# Patient Record
Sex: Female | Born: 1950 | Race: White | Hispanic: No | Smoking: Never smoker
Health system: Southern US, Community
[De-identification: ages and names within clinical notes are randomized; demographics above are authoritative.]

## PROBLEM LIST (undated history)

## (undated) DIAGNOSIS — F32A Depression, unspecified: Secondary | ICD-10-CM

## (undated) DIAGNOSIS — M069 Rheumatoid arthritis, unspecified: Secondary | ICD-10-CM

## (undated) DIAGNOSIS — J302 Other seasonal allergic rhinitis: Secondary | ICD-10-CM

## (undated) DIAGNOSIS — F329 Major depressive disorder, single episode, unspecified: Secondary | ICD-10-CM

## (undated) DIAGNOSIS — Z973 Presence of spectacles and contact lenses: Secondary | ICD-10-CM

## (undated) DIAGNOSIS — F419 Anxiety disorder, unspecified: Secondary | ICD-10-CM

## (undated) DIAGNOSIS — K219 Gastro-esophageal reflux disease without esophagitis: Secondary | ICD-10-CM

## (undated) HISTORY — PX: INGUINAL HERNIA REPAIR: SUR1180

## (undated) HISTORY — PX: COLONOSCOPY: SHX174

## (undated) HISTORY — PX: TUBAL LIGATION: SHX77

---

## 1983-07-09 HISTORY — PX: SEPTOPLASTY: SUR1290

## 1993-07-08 HISTORY — PX: DILATION AND CURETTAGE OF UTERUS: SHX78

## 1995-07-09 HISTORY — PX: ABDOMINAL HYSTERECTOMY: SHX81

## 1998-04-13 ENCOUNTER — Other Ambulatory Visit: Admission: RE | Admit: 1998-04-13 | Discharge: 1998-04-13 | Payer: Self-pay | Admitting: *Deleted

## 1999-05-10 ENCOUNTER — Other Ambulatory Visit: Admission: RE | Admit: 1999-05-10 | Discharge: 1999-05-10 | Payer: Self-pay | Admitting: *Deleted

## 1999-12-28 ENCOUNTER — Emergency Department (HOSPITAL_COMMUNITY): Admission: EM | Admit: 1999-12-28 | Discharge: 1999-12-28 | Payer: Self-pay | Admitting: Podiatry

## 2000-05-01 ENCOUNTER — Ambulatory Visit (HOSPITAL_COMMUNITY): Admission: RE | Admit: 2000-05-01 | Discharge: 2000-05-01 | Payer: Self-pay | Admitting: *Deleted

## 2000-05-21 ENCOUNTER — Other Ambulatory Visit: Admission: RE | Admit: 2000-05-21 | Discharge: 2000-05-21 | Payer: Self-pay | Admitting: *Deleted

## 2001-07-27 ENCOUNTER — Other Ambulatory Visit: Admission: RE | Admit: 2001-07-27 | Discharge: 2001-07-27 | Payer: Self-pay | Admitting: *Deleted

## 2002-08-03 ENCOUNTER — Other Ambulatory Visit: Admission: RE | Admit: 2002-08-03 | Discharge: 2002-08-03 | Payer: Self-pay | Admitting: *Deleted

## 2003-07-18 ENCOUNTER — Other Ambulatory Visit: Admission: RE | Admit: 2003-07-18 | Discharge: 2003-07-18 | Payer: Self-pay | Admitting: Family Medicine

## 2004-01-31 ENCOUNTER — Ambulatory Visit (HOSPITAL_COMMUNITY): Admission: RE | Admit: 2004-01-31 | Discharge: 2004-01-31 | Payer: Self-pay | Admitting: Family Medicine

## 2008-07-28 ENCOUNTER — Other Ambulatory Visit: Admission: RE | Admit: 2008-07-28 | Discharge: 2008-07-28 | Payer: Self-pay | Admitting: Family Medicine

## 2009-07-08 HISTORY — PX: BLEPHAROPLASTY: SUR158

## 2013-06-29 ENCOUNTER — Other Ambulatory Visit: Payer: Self-pay | Admitting: Orthopedic Surgery

## 2013-07-13 ENCOUNTER — Encounter (HOSPITAL_BASED_OUTPATIENT_CLINIC_OR_DEPARTMENT_OTHER): Payer: Self-pay | Admitting: *Deleted

## 2013-07-13 NOTE — Progress Notes (Signed)
No labs needed

## 2013-07-15 NOTE — H&P (Signed)
  Shelia Kerr is an 63 y.o. female.   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 She is busy swimming for exercise. She has noted a mass at the anterior antecubital region of her left elbow 6 months prior. This measures 2 by 2 cm. It is soft, mobile and nontender. She has other family members who have had masses excised that were noted to be degenerative myxoid cysts. She seeks an upper extremity consult for biopsy.    Past Medical History  Diagnosis Date  . Rheumatoid arthritis   . Wears glasses   . Seasonal allergies   . GERD (gastroesophageal reflux disease)     occ  . Depression   . Anxiety     Past Surgical History  Procedure Laterality Date  . Blepharoplasty  2011    both eyes  . Inguinal hernia repair      right-before age 87  . Abdominal hysterectomy  1997  . Tubal ligation    . Dilation and curettage of uterus  1995    fibroid  . Colonoscopy    . Septoplasty  1985    History reviewed. No pertinent family history. Social History:  reports that she has never smoked. She does not have any smokeless tobacco history on file. She reports that she drinks alcohol. She reports that she does not use illicit drugs.  Allergies:  Allergies  Allergen Reactions  . Sulfa Antibiotics Hives    Mouth and perineal blisters    No prescriptions prior to admission    No results found for this or any previous visit (from the past 47 hour(s)).  No results found.   Pertinent items are noted in HPI.  Height 6' (1.829 m), weight 82.555 kg (182 lb).  General appearance: alert Head: Normocephalic, without obvious abnormality Neck: supple, symmetrical, trachea midline Resp: clear to auscultation bilaterally Cardio: regular rate and rhythm GI: normal findings: bowel sounds normal Extremities: . 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 This is mobile and  nontender and has the feel of a lipoma. Her pulses and capillary refill are intact. Her motor and sensory exam is unremarkable. She has full ROM of her elbow, forearm, wrist and fingers.   Plain films are nondiagnostic Pulses: 2+ and symmetric Skin: normal Neurologic: Grossly normal    Assessment/Plan Impression:  Mass left antecubital fossa.  Plan: To the OR for excisional biopsy of mass left antecubital fossa.The procedure, risks,benefits and post-op course were discussed with the patient at length and they were in agreement with the plan.  DASNOIT,Lucciano Vitali J 07/15/2013, 4:51 PM  H&P documentation: 07/16/2013  -History and Physical Reviewed  -Patient has been re-examined  -No change in the plan of care  Cammie Sickle, MD

## 2013-07-16 ENCOUNTER — Ambulatory Visit (HOSPITAL_BASED_OUTPATIENT_CLINIC_OR_DEPARTMENT_OTHER): Payer: BC Managed Care – PPO | Admitting: *Deleted

## 2013-07-16 ENCOUNTER — Encounter (HOSPITAL_BASED_OUTPATIENT_CLINIC_OR_DEPARTMENT_OTHER): Payer: Self-pay | Admitting: *Deleted

## 2013-07-16 ENCOUNTER — Encounter (HOSPITAL_BASED_OUTPATIENT_CLINIC_OR_DEPARTMENT_OTHER): Payer: BC Managed Care – PPO | Admitting: *Deleted

## 2013-07-16 ENCOUNTER — Ambulatory Visit (HOSPITAL_BASED_OUTPATIENT_CLINIC_OR_DEPARTMENT_OTHER)
Admission: RE | Admit: 2013-07-16 | Discharge: 2013-07-16 | Disposition: A | Discharge status: Home / Self Care | Payer: BC Managed Care – PPO | Source: Ambulatory Visit | Attending: Orthopedic Surgery | Admitting: Orthopedic Surgery

## 2013-07-16 ENCOUNTER — Encounter (HOSPITAL_BASED_OUTPATIENT_CLINIC_OR_DEPARTMENT_OTHER)
Admission: RE | Disposition: A | Discharge status: Home / Self Care | Payer: Self-pay | Source: Ambulatory Visit | Attending: Orthopedic Surgery

## 2013-07-16 DIAGNOSIS — F329 Major depressive disorder, single episode, unspecified: Secondary | ICD-10-CM | POA: Insufficient documentation

## 2013-07-16 DIAGNOSIS — Z90710 Acquired absence of both cervix and uterus: Secondary | ICD-10-CM | POA: Insufficient documentation

## 2013-07-16 DIAGNOSIS — F411 Generalized anxiety disorder: Secondary | ICD-10-CM | POA: Insufficient documentation

## 2013-07-16 DIAGNOSIS — M069 Rheumatoid arthritis, unspecified: Secondary | ICD-10-CM | POA: Insufficient documentation

## 2013-07-16 DIAGNOSIS — Z882 Allergy status to sulfonamides status: Secondary | ICD-10-CM | POA: Insufficient documentation

## 2013-07-16 DIAGNOSIS — D1779 Benign lipomatous neoplasm of other sites: Secondary | ICD-10-CM | POA: Insufficient documentation

## 2013-07-16 DIAGNOSIS — K219 Gastro-esophageal reflux disease without esophagitis: Secondary | ICD-10-CM | POA: Insufficient documentation

## 2013-07-16 DIAGNOSIS — Z9889 Other specified postprocedural states: Secondary | ICD-10-CM | POA: Insufficient documentation

## 2013-07-16 DIAGNOSIS — F3289 Other specified depressive episodes: Secondary | ICD-10-CM | POA: Insufficient documentation

## 2013-07-16 HISTORY — DX: Other seasonal allergic rhinitis: J30.2

## 2013-07-16 HISTORY — PX: BONE BIOPSY: SHX375

## 2013-07-16 HISTORY — DX: Gastro-esophageal reflux disease without esophagitis: K21.9

## 2013-07-16 HISTORY — DX: Major depressive disorder, single episode, unspecified: F32.9

## 2013-07-16 HISTORY — DX: Rheumatoid arthritis, unspecified: M06.9

## 2013-07-16 HISTORY — DX: Anxiety disorder, unspecified: F41.9

## 2013-07-16 HISTORY — DX: Depression, unspecified: F32.A

## 2013-07-16 HISTORY — DX: Presence of spectacles and contact lenses: Z97.3

## 2013-07-16 SURGERY — BIOPSY, BONE
Anesthesia: Monitor Anesthesia Care | Site: Arm Upper | Laterality: Left

## 2013-07-16 MED ORDER — MIDAZOLAM HCL 5 MG/5ML IJ SOLN
INTRAMUSCULAR | Status: DC | PRN
  Administered 2013-07-16: 2 mg via INTRAVENOUS

## 2013-07-16 MED ORDER — LACTATED RINGERS IV SOLN
INTRAVENOUS | Status: DC
  Administered 2013-07-16: 08:00:00 via INTRAVENOUS

## 2013-07-16 MED ORDER — MIDAZOLAM HCL 2 MG/2ML IJ SOLN: 1.0000 mg | INTRAMUSCULAR | Status: DC | PRN

## 2013-07-16 MED ORDER — CHLORHEXIDINE GLUCONATE 4 % EX LIQD
60.0000 mL | Freq: Once | CUTANEOUS | Status: AC
  Administered 2013-07-16: 4 via TOPICAL

## 2013-07-16 MED ORDER — ONDANSETRON HCL 4 MG/2ML IJ SOLN: 4.0000 mg | Freq: Four times a day (QID) | INTRAMUSCULAR | Status: DC | PRN

## 2013-07-16 MED ORDER — FENTANYL CITRATE 0.05 MG/ML IJ SOLN
INTRAMUSCULAR | Status: DC | PRN
  Administered 2013-07-16: 100 ug via INTRAVENOUS

## 2013-07-16 MED ORDER — ONDANSETRON HCL 4 MG/2ML IJ SOLN
INTRAMUSCULAR | Status: DC | PRN
  Administered 2013-07-16: 4 mg via INTRAVENOUS

## 2013-07-16 MED ORDER — FENTANYL CITRATE 0.05 MG/ML IJ SOLN: 25.0000 ug | INTRAMUSCULAR | Status: DC | PRN

## 2013-07-16 MED ORDER — PROPOFOL INFUSION 10 MG/ML OPTIME
INTRAVENOUS | Status: DC | PRN
  Administered 2013-07-16: 100 ug/kg/min via INTRAVENOUS

## 2013-07-16 MED ORDER — OXYCODONE HCL 5 MG/5ML PO SOLN: 5.0000 mg | Freq: Once | ORAL | Status: DC | PRN

## 2013-07-16 MED ORDER — PROPOFOL 10 MG/ML IV BOLUS
INTRAVENOUS | Status: AC
  Filled 2013-07-16: qty 20

## 2013-07-16 MED ORDER — METHYLPREDNISOLONE ACETATE 40 MG/ML IJ SUSP
INTRAMUSCULAR | Status: AC
  Filled 2013-07-16: qty 1

## 2013-07-16 MED ORDER — MIDAZOLAM HCL 2 MG/2ML IJ SOLN
INTRAMUSCULAR | Status: AC
  Filled 2013-07-16: qty 2

## 2013-07-16 MED ORDER — PROPOFOL 10 MG/ML IV BOLUS
INTRAVENOUS | Status: DC | PRN
  Administered 2013-07-16: 40 mg via INTRAVENOUS

## 2013-07-16 MED ORDER — LIDOCAINE HCL 2 % IJ SOLN
INTRAMUSCULAR | Status: AC
  Filled 2013-07-16: qty 20

## 2013-07-16 MED ORDER — FENTANYL CITRATE 0.05 MG/ML IJ SOLN: 50.0000 ug | INTRAMUSCULAR | Status: DC | PRN

## 2013-07-16 MED ORDER — LIDOCAINE HCL 2 % IJ SOLN
INTRAMUSCULAR | Status: DC | PRN
  Administered 2013-07-16: 7 mL

## 2013-07-16 MED ORDER — OXYCODONE HCL 5 MG PO TABS: 5.0000 mg | ORAL_TABLET | Freq: Once | ORAL | Status: DC | PRN

## 2013-07-16 MED ORDER — FENTANYL CITRATE 0.05 MG/ML IJ SOLN
INTRAMUSCULAR | Status: AC
  Filled 2013-07-16: qty 4

## 2013-07-16 SURGICAL SUPPLY — 58 items
BANDAGE ADH SHEER 1  50/CT (GAUZE/BANDAGES/DRESSINGS) IMPLANT
BANDAGE ELASTIC 3 VELCRO ST LF (GAUZE/BANDAGES/DRESSINGS) IMPLANT
BANDAGE ELASTIC 4 VELCRO ST LF (GAUZE/BANDAGES/DRESSINGS) IMPLANT
BLADE SURG 15 STRL LF DISP TIS (BLADE) ×2 IMPLANT
BLADE SURG 15 STRL SS (BLADE) ×4
BNDG COHESIVE 3X5 TAN STRL LF (GAUZE/BANDAGES/DRESSINGS) IMPLANT
BNDG COHESIVE 4X5 TAN STRL (GAUZE/BANDAGES/DRESSINGS) IMPLANT
BNDG ESMARK 4X9 LF (GAUZE/BANDAGES/DRESSINGS) ×3 IMPLANT
BNDG GAUZE ELAST 4 BULKY (GAUZE/BANDAGES/DRESSINGS) IMPLANT
BRUSH SCRUB EZ PLAIN DRY (MISCELLANEOUS) ×3 IMPLANT
CLOSURE WOUND 1/2 X4 (GAUZE/BANDAGES/DRESSINGS) ×1
CORDS BIPOLAR (ELECTRODE) ×3 IMPLANT
COVER MAYO STAND STRL (DRAPES) ×3 IMPLANT
COVER TABLE BACK 60X90 (DRAPES) ×3 IMPLANT
CUFF TOURNIQUET SINGLE 18IN (TOURNIQUET CUFF) ×3 IMPLANT
DECANTER SPIKE VIAL GLASS SM (MISCELLANEOUS) IMPLANT
DRAPE EXTREMITY T 121X128X90 (DRAPE) ×3 IMPLANT
DRAPE SURG 17X23 STRL (DRAPES) ×3 IMPLANT
DRSG TEGADERM 2-3/8X2-3/4 SM (GAUZE/BANDAGES/DRESSINGS) ×3 IMPLANT
GAUZE XEROFORM 1X8 LF (GAUZE/BANDAGES/DRESSINGS) ×3 IMPLANT
GLOVE BIO SURGEON STRL SZ 6.5 (GLOVE) ×2 IMPLANT
GLOVE BIO SURGEONS STRL SZ 6.5 (GLOVE) ×1
GLOVE BIOGEL M STRL SZ7.5 (GLOVE) ×3 IMPLANT
GLOVE BIOGEL PI IND STRL 7.0 (GLOVE) ×2 IMPLANT
GLOVE BIOGEL PI INDICATOR 7.0 (GLOVE) ×4
GLOVE ECLIPSE 7.0 STRL STRAW (GLOVE) ×3 IMPLANT
GLOVE ORTHO TXT STRL SZ7.5 (GLOVE) ×3 IMPLANT
GOWN STRL REUS W/ TWL LRG LVL3 (GOWN DISPOSABLE) ×1 IMPLANT
GOWN STRL REUS W/ TWL XL LVL3 (GOWN DISPOSABLE) ×2 IMPLANT
GOWN STRL REUS W/TWL LRG LVL3 (GOWN DISPOSABLE) ×2
GOWN STRL REUS W/TWL XL LVL3 (GOWN DISPOSABLE) ×4
NEEDLE 27GAX1X1/2 (NEEDLE) ×3 IMPLANT
NS IRRIG 1000ML POUR BTL (IV SOLUTION) ×3 IMPLANT
PACK BASIN DAY SURGERY FS (CUSTOM PROCEDURE TRAY) ×3 IMPLANT
PAD CAST 3X4 CTTN HI CHSV (CAST SUPPLIES) IMPLANT
PADDING CAST ABS 4INX4YD NS (CAST SUPPLIES)
PADDING CAST ABS COTTON 4X4 ST (CAST SUPPLIES) IMPLANT
PADDING CAST COTTON 3X4 STRL (CAST SUPPLIES)
SPLINT PLASTER CAST XFAST 3X15 (CAST SUPPLIES) IMPLANT
SPLINT PLASTER XTRA FASTSET 3X (CAST SUPPLIES)
SPONGE GAUZE 2X2 8PLY STER LF (GAUZE/BANDAGES/DRESSINGS) ×1
SPONGE GAUZE 2X2 8PLY STRL LF (GAUZE/BANDAGES/DRESSINGS) ×2 IMPLANT
SPONGE GAUZE 4X4 12PLY (GAUZE/BANDAGES/DRESSINGS) ×3 IMPLANT
STOCKINETTE 4X48 STRL (DRAPES) ×3 IMPLANT
STRIP CLOSURE SKIN 1/2X4 (GAUZE/BANDAGES/DRESSINGS) ×2 IMPLANT
SUT ETHIBOND 3-0 V-5 (SUTURE) IMPLANT
SUT PROLENE 3 0 PS 2 (SUTURE) ×3 IMPLANT
SUT VIC AB 0 SH 27 (SUTURE) IMPLANT
SUT VIC AB 2-0 PS2 27 (SUTURE) IMPLANT
SUT VIC AB 3-0 FS2 27 (SUTURE) IMPLANT
SUT VIC AB 4-0 BRD 54 (SUTURE) IMPLANT
SUT VIC AB 4-0 P-3 18XBRD (SUTURE) ×1 IMPLANT
SUT VIC AB 4-0 P3 18 (SUTURE) ×2
SYR 3ML 23GX1 SAFETY (SYRINGE) IMPLANT
SYR CONTROL 10ML LL (SYRINGE) ×3 IMPLANT
TOWEL OR 17X24 6PK STRL BLUE (TOWEL DISPOSABLE) ×3 IMPLANT
TRAY DSU PREP LF (CUSTOM PROCEDURE TRAY) ×3 IMPLANT
UNDERPAD 30X30 INCONTINENT (UNDERPADS AND DIAPERS) ×3 IMPLANT

## 2013-07-16 NOTE — Brief Op Note (Signed)
07/16/2013  8:10 AM  PATIENT:  Shelia Kerr  63 y.o. female  PRE-OPERATIVE DIAGNOSIS:  LIPOMA LEFT ANTECUBITAL FOSSA  POST-OPERATIVE DIAGNOSIS: LIPOMA OF LEFT ANTECUBITAL FOSSA  PROCEDURE:  Procedure(s): EXCISIONAL BIOPSY LEFT ANTECUBITAL FOSSA (Left)  SURGEON:  Surgeon(s) and Role:    * Cammie Sickle., MD - Primary  PHYSICIAN ASSISTANT:   ASSISTANTS: SURGICAL TECH  ANESTHESIA:   IV sedation  EBL:  Total I/O In: 800 [I.V.:800] Out: -   BLOOD ADMINISTERED:none  DRAINS: none   LOCAL MEDICATIONS USED:  XYLOCAINE   SPECIMEN:  Biopsy / Limited Resection  DISPOSITION OF SPECIMEN:  PATHOLOGY  COUNTS:  YES  TOURNIQUET:   Total Tourniquet Time Documented: Upper Arm (Left) - 11 minutes Total: Upper Arm (Left) - 11 minutes   DICTATION: .Other Dictation: Dictation Number [id number]  PLAN OF CARE: Discharge to home after PACU  PATIENT DISPOSITION:  PACU - hemodynamically stable.   Delay start of Pharmacological VTE agent (>24hrs) due to surgical blood loss or risk of bleeding: not applicable

## 2013-07-16 NOTE — Transfer of Care (Signed)
Immediate Anesthesia Transfer of Care Note  Patient: Shelia Kerr  Procedure(s) Performed: Procedure(s): EXCISIONAL BIOPSY LEFT ANTECUBITAL FOSSA (Left)  Patient Location: PACU  Anesthesia Type:MAC  Level of Consciousness: awake, alert  and oriented  Airway & Oxygen Therapy: Patient Spontanous Breathing and Patient connected to face mask oxygen  Post-op Assessment: Report given to PACU RN, Post -op Vital signs reviewed and stable and Patient moving all extremities  Post vital signs: Reviewed and stable  Complications: No apparent anesthesia complications

## 2013-07-16 NOTE — Anesthesia Postprocedure Evaluation (Signed)
Anesthesia Post Note  Patient: Shelia Kerr  Procedure(s) Performed: Procedure(s) (LRB): EXCISIONAL BIOPSY LEFT ANTECUBITAL FOSSA (Left)  Anesthesia type: MAC  Patient location: PACU  Post pain: Pain level controlled and Adequate analgesia  Post assessment: Post-op Vital signs reviewed, Patient's Cardiovascular Status Stable and Respiratory Function Stable  Last Vitals:  Filed Vitals:   07/16/13 0830  BP: 110/64  Pulse: 47  Temp:   Resp: 14    Post vital signs: Reviewed and stable  Level of consciousness: awake, alert  and oriented  Complications: No apparent anesthesia complications

## 2013-07-16 NOTE — Anesthesia Preprocedure Evaluation (Signed)
Anesthesia Evaluation  Patient identified by MRN, date of birth, ID band Patient awake    Reviewed: Allergy & Precautions, H&P , NPO status , Patient's Chart, lab work & pertinent test results  Airway Mallampati: II  Neck ROM: full    Dental   Pulmonary neg pulmonary ROS,          Cardiovascular negative cardio ROS      Neuro/Psych Anxiety Depression    GI/Hepatic GERD-  ,  Endo/Other    Renal/GU      Musculoskeletal  (+) Arthritis -, Rheumatoid disorders,    Abdominal   Peds  Hematology   Anesthesia Other Findings   Reproductive/Obstetrics                           Anesthesia Physical Anesthesia Plan  ASA: II  Anesthesia Plan: General   Post-op Pain Management:    Induction: Intravenous  Airway Management Planned: LMA  Additional Equipment:   Intra-op Plan:   Post-operative Plan:   Informed Consent: I have reviewed the patients History and Physical, chart, labs and discussed the procedure including the risks, benefits and alternatives for the proposed anesthesia with the patient or authorized representative who has indicated his/her understanding and acceptance.     Plan Discussed with: CRNA, Anesthesiologist and Surgeon  Anesthesia Plan Comments:         Anesthesia Quick Evaluation

## 2013-07-16 NOTE — Anesthesia Procedure Notes (Signed)
Procedure Name: MAC Date/Time: 07/16/2013 7:42 AM Performed by: Cammie Sickle Pre-anesthesia Checklist: Patient identified, Emergency Drugs available, Suction available and Patient being monitored Patient Re-evaluated:Patient Re-evaluated prior to inductionOxygen Delivery Method: Simple face mask Preoxygenation: Pre-oxygenation with 100% oxygen Dental Injury: Teeth and Oropharynx as per pre-operative assessment

## 2013-07-16 NOTE — Discharge Instructions (Addendum)

## 2013-07-16 NOTE — Op Note (Signed)
[id number]  

## 2013-07-17 NOTE — Op Note (Signed)
NAMEADRIA, [name]              ACCOUNT NO.:  [id number]  MEDICAL RECORD NO.:  [id number]  LOCATION:                                 FACILITY:  PHYSICIAN:  Shelia Kerr, M.D.      DATE OF BIRTH:  DATE OF PROCEDURE:  07/16/2013 DATE OF DISCHARGE:                              OPERATIVE REPORT   PREOPERATIVE DIAGNOSIS:  A 2 x 2-cm mass consistent with lipoma, left antecubital fossa.  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  OPERATIONS:  Excisional biopsy of 2 x 2-cm mass, left antecubital fossa.  OPERATING SURGEON:  Shelia Mighty. Jaqulyn Chancellor, MD.  ASSISTANT:  Surgical technician.  ANESTHESIA:  2% lidocaine field block supplemented by IV sedation.  SUPERVISING ANESTHESIOLOGIST:  Shelia Ghee, MD.  INDICATIONS:  Shelia Kerr is a 63 year old woman referred through the courtesy of her attending rheumatologist, Dr. Gavin Pound for biopsy of an antecubital mass on the left.  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  She was concerned about an enlarging mass in her antecubital fossa.  We advised her that this likely was a well-differentiated lipoma and could be observed.  She requested that we proceed with biopsy for confirmation of this impression.  She is scheduled for excisional biopsy under local anesthesia and sedation at this time.  PROCEDURE IN DETAIL:  Shelia Kerr was brought to room 1 of the [facility] and placed in supine position on the operating table.  Following IV sedation, the left antecubital fossa was prepped with Betadine and infiltrated with 7 mL of 2% lidocaine.  After few moments, excellent anesthesia was achieved.  The left hand and arm were then prepped with Betadine soap and solution, sterilely draped.  A pneumatic tourniquet was applied to the proximal left brachium.  Following exsanguination of the left arm with Esmarch bandage, arterial tourniquet was inflated to 220 mmHg.  A routine  surgical time-out was accomplished.  The procedure commenced with a transverse incision of 2 cm directly over the mass.  Subcutaneous tissues were carefully divided taking care to look for branches of the medial antebrachial cutaneous nerve.  With very careful dissection, a well differentiated lipoma was dissected off the fascia and surrounding subcutaneous tissues.  This was very well differentiated and quite challenging to differentiate from the surrounding normal adipose tissue, except for the fact that it had a firmer density.  With great care, this was circumferentially dissected and removed and placed in formalin for pathologic evaluation.  I then used a rongeur to contour the adjacent fat to prevent a dimple defect.  The wound was inspected for bleeding points followed by repair of the skin with subcutaneous 4-0 Vicryl and intradermal 3-0 Prolene with Steri-Strips.  Sterile gauze and Tegaderm was applied for postoperative sterile dressing.  There were no apparent complications.  For aftercare, Shelia Kerr is provided a prescription for naproxen sodium. She reports that she already has some at home and does not require a formal prescription.  She will also use Tylenol.  She chooses not to have any opioid analgesics.     Shelia Mighty Shelia Kerr, M.D.     RVS/MEDQ  D:  07/16/2013  T:  07/16/2013  Job:  [id number]  cc:   Gavin Pound, MD

## 2013-07-19 ENCOUNTER — Encounter (HOSPITAL_BASED_OUTPATIENT_CLINIC_OR_DEPARTMENT_OTHER): Payer: Self-pay | Admitting: Orthopedic Surgery

## 2013-11-18 ENCOUNTER — Other Ambulatory Visit: Payer: Self-pay | Admitting: Family Medicine

## 2013-11-18 DIAGNOSIS — E010 Iodine-deficiency related diffuse (endemic) goiter: Secondary | ICD-10-CM

## 2013-11-22 ENCOUNTER — Ambulatory Visit
Admission: RE | Admit: 2013-11-22 | Discharge: 2013-11-22 | Disposition: A | Discharge status: Home / Self Care | Payer: BC Managed Care – PPO | Source: Ambulatory Visit | Attending: Family Medicine | Admitting: Family Medicine

## 2013-11-22 DIAGNOSIS — E010 Iodine-deficiency related diffuse (endemic) goiter: Secondary | ICD-10-CM

## 2014-01-18 DIAGNOSIS — N8110 Cystocele, unspecified: Secondary | ICD-10-CM | POA: Insufficient documentation

## 2014-01-18 DIAGNOSIS — R32 Unspecified urinary incontinence: Secondary | ICD-10-CM | POA: Insufficient documentation

## 2014-06-01 ENCOUNTER — Other Ambulatory Visit: Payer: Self-pay | Admitting: Dermatology

## 2015-09-18 DIAGNOSIS — M0609 Rheumatoid arthritis without rheumatoid factor, multiple sites: Secondary | ICD-10-CM | POA: Insufficient documentation

## 2017-02-26 ENCOUNTER — Other Ambulatory Visit: Payer: Self-pay | Admitting: Family Medicine

## 2017-02-26 DIAGNOSIS — R1084 Generalized abdominal pain: Secondary | ICD-10-CM

## 2017-03-11 ENCOUNTER — Ambulatory Visit
Admission: RE | Admit: 2017-03-11 | Discharge: 2017-03-11 | Disposition: A | Discharge status: Home / Self Care | Payer: Medicare Other | Source: Ambulatory Visit | Attending: Family Medicine | Admitting: Family Medicine

## 2017-03-11 DIAGNOSIS — R1084 Generalized abdominal pain: Secondary | ICD-10-CM

## 2017-03-12 ENCOUNTER — Other Ambulatory Visit: Payer: BC Managed Care – PPO

## 2018-01-13 ENCOUNTER — Other Ambulatory Visit: Payer: Self-pay | Admitting: Family Medicine

## 2018-01-13 ENCOUNTER — Ambulatory Visit
Admission: RE | Admit: 2018-01-13 | Discharge: 2018-01-13 | Disposition: A | Discharge status: Home / Self Care | Payer: Medicare Other | Source: Ambulatory Visit | Attending: Family Medicine | Admitting: Family Medicine

## 2018-01-13 DIAGNOSIS — R05 Cough: Secondary | ICD-10-CM

## 2018-01-13 DIAGNOSIS — R059 Cough, unspecified: Secondary | ICD-10-CM

## 2018-04-22 ENCOUNTER — Ambulatory Visit: Payer: Medicare Other | Admitting: Psychiatry

## 2018-04-22 DIAGNOSIS — F4323 Adjustment disorder with mixed anxiety and depressed mood: Secondary | ICD-10-CM | POA: Insufficient documentation

## 2018-04-22 DIAGNOSIS — M0609 Rheumatoid arthritis without rheumatoid factor, multiple sites: Secondary | ICD-10-CM | POA: Diagnosis not present

## 2018-04-22 NOTE — Assessment & Plan Note (Addendum)
Longterm adjustment to family issues incl. adult daughter's psych issues, adult son's Asperger's, and divorce after long, punishing marriage to NPD

## 2018-04-22 NOTE — Assessment & Plan Note (Signed)
A/o 04/22/18, reports 3 week on Plaquanil

## 2018-04-22 NOTE — Progress Notes (Signed)
[facility] Counselor/Therapist Progress Note   Patient ID: Shelia Kerr, MRN: [id number]  Date: 04/22/2018  Timespent: 50 minutes Start time: 8:15a Stop time: 9:06a  Treatment Type: Individual  Subjective:  Depressive this summer.  On Plaquanil (generic) for RA now about 3 weeks, coming off frustration of summer in more pain while unmedicated.  67yo now and feeling age.  Son Legrand Como (Asperger's, [place]) still living with her between assignments, and his plans to return to [place] frustrated by new red tape.  Asperger's habits of being up till 3am, compulsive chess-playing, brittle reactions, and family characteristic of defensiveness at small things.  Patient struggling sometimes with her own defensive reactions, but making shorter work of saying no, refusing shoehorned interactions, and seemingly doing so with less ire and defensiveness over time.  Still hopeful of his transition back to living independently, whether overseas or here.  Notable argument about the origins of oak trees at the cemetery by [facility], seemed to be more a conflict about whether she is allowed to have an independent point of view than the actual subject matter.    [name] got into EMDR for her childhood triggers but not keeping house, relapsing in brittle reactions, had a meltdown while on hers and Heron Sabins' delayed honeymoon in [place], enough to where [employer] her therapist.  PT generously had flooring put in to replace old, allergenic carpet and watched the house and dogs for 3 weeks, accompanied by her own good friend Suzi Roots, who has a Medical illustrator with [name].  Passed up several triggers to react, but grieving new indacaitons that [name] continues to have strong tendencies to expect others to change their behavior to settle her own distress, she is not making real friends where she is, and her husband is starting to take more of a stand on unreasonable behavior.  Worries she will reject a truly good  man, through being harsh, inhospitable, or letting her own defensive imagination get away with her.  Socially, not in a church yet or a book club that suits after 2 years.  Has been contacted for an Alzheimer's study through [facility] that may offer a social component but unsure of next steps, means to call back.  Has identified a multicultural [place] [employer] with a dynamic young pastor that is appealing, but held back by apprehension about possibly running into people who know her from before, and she experiences a constitutional aversion to having to be recognized, and feeling inevitably boxed into an old role.  Notable sign of life and finding herself attracted to a fellow passenger on the plane when traveling to [name].  Interventions:CBT, Counsellor and Reframing Agreed still working on deconditioning learned responses to conflict from father and ex-husband, reinforced in [place] environment with constitutionally stubborn, defensive, now-adult children.  Affirmed growing abilities to pass up squabbling, make shorter work of defensive conflict, rect less drastically to provocations, and employ strategic silence/nonresponse.  Challenged her assumptions about being "boxed in" by any encounter she imaginably have with acquaintances and visualized having a comforting touch in a comforting voice remind her in the midst of such an encounter she still has full choice and is not in any way defined by what she used to do, and particularly that she simply has the right to be a seeker of community and faith when she shows up.  Endorsed idea of Alzheimer study and contextualized fear of aging by comparing and contrasting her status and outlook with [place] family member.  Mental Status Exam:   Appearance:   Neat and Well Groomed     Behavior:  Appropriate and Sharing  Motor:  Normal  Speech/Language:   Clear and Coherent  Affect:  Appropriate and Full Range  Mood:  somewhat anxious, apprehensive   Thought process:  Relevant  Thought content:    Logical  Perceptual disturbances:    Normal  Orientation:  Full (Time, Place, and Person)  Attention:  Good  Concentration:  good  Memory:  No dfxs  Fund of knowledge:   Good  Insight:    Good  Judgment:   Good  Impulse Control:  good    Reported Symptoms:  Worry, body pain, fatigue, frustration  Risk Assessment: Danger to Self:  No Self-injurious Behavior: No Danger to Others: No Duty to Warn:no Physical Aggression / Violence:No  Access to Firearms a concern: No  Gang Involvement:No   Diagnosis:   ICD-10-CM   1. Adjustment disorder with mixed anxiety and depressed mood F43.23      Plan:  . Get in touch about Alzheimer's study and socialization opportunities involved . Go ahead and try out identified church of interest . Self-affirm that she is already not defined by the reactions of others but always has the right to decide how to play a situation that is initially uncomfortable.  "OK until proven otherwise." . RTO at discretion . Continue to utilize previously learned skills ad lib . Maintain medication, if prescribed, and work faithfully with relevant prescriber(s) . Call the clinic on-call service, present to ER, or call 911 if any life-threatening emergency  Blanchie Serve, PhD

## 2018-08-18 ENCOUNTER — Ambulatory Visit: Payer: Medicare Other | Admitting: Psychiatry

## 2018-08-18 DIAGNOSIS — F4323 Adjustment disorder with mixed anxiety and depressed mood: Secondary | ICD-10-CM

## 2018-08-18 NOTE — Progress Notes (Signed)
Psychotherapy Progress Note [facility] [name], PhD LP  Patient ID: Shelia Kerr     MRN: [id number]      Therapy format: Individual psychotherapy Date: 08/18/2018     Start: 8:11a Stop: 9:01a Time Spent: 50 min  Session narrative -- presenting needs, interim history, self-report of stressors and symptoms, applications of prior therapy, status changes, and interventions made in session "Here for my quarterly sanity check."  Shelia Kerr doing better, in general, EMDR making some difference.  Shelia Kerr seems to have a natural way with her emotional reactivity.  Recent inquiry from her about history when PT was irritable recovering form hysterectomy.  Managed to validate remembrance that she was angry back then and deliver a sincere sorry, without disputing memory details.  Confronted in process skipping on to other concerns without stopping to take credit/validation, pressed her to let the good news be good news about raising her communication and conflict skills before racing to the next problem.  Able to do so.  Christmas between the kids and their dad and his wife did not go well, but it was validating and nonprovocative between PT and her grown children.  True to form for ex-husband Shelia Kerr, Shelia Kerr wound up getting stuck with the bill at dinner, he took an inquiry about the history of an antique quilt as his cue to offer to sell it to her, and after she returned to [place] and stewed about it, [employer] him how she knows now about all his crimes back to teen years and has read his mother's journals to prove it (something he has worried would be discovered).  Also divulged to PT that Shelia Kerr disclosed how their marriage has never been consummated.  TMI, but also vindicating, and PT did not trigger upon any of these learnings to try to manage her kids' feelings or understandings about their father, just sedate and supportive.  Tiring of Shelia Kerr living with her and his 68 year old  early-teenager reactions.  Reacting to PT getting confused a little with things like "I can't believe you .Shelia Kerr Kitchen. you never listen" statements.  Asperger's drives great defensiveness and artful ways of criticizing her thinking, accusing her of emotional reasoning, and other derogatory things he has heard his father and sister say before.  Continued to advocate to PT to seek to manage the format of conflict more than trying to defend or logic any particular point -- e.g., ask if he is done, ask if she can take her turn, ask if he will let her make her point, etc.  PT fully expects he would cut her off and tell her she doesn't know how to make a point, so, if so, advised just call an end to the conversation and let him know she can talk when he is ready to make it a dialogue instead of a monologue.  Discussed briefly an anxiety-provoking scene in which he wanted to pilfer another customer's biscuits at [employer] because they were the "right" color and he was convinced they would not need them.  Actually questioned why it would be wrong to take them (momentarily evoking ex-husband's psychopathy).  PT's reflex to threaten to walk out if he does, and then to just say "It's not what we do".  Discussed ineffective discipline, empathized with anxious reflex to just spit out a sanction or an unexplained label, and encouraged to classify it, e.g., "Because it is stealing / not yours." and stay ready to manage the process, abandon content, and if hard-pressed,  allow son to meet social consequences.  Empathic stress and pressure to be responsible with a friend with breast cancer, father's health tanking, sisters turning to her for guidance.  Medically, had one cataract done recently, sees much better.  Other one to come soon.  Therapeutic modalities: Assertiveness/Communication, Solution-Oriented/Positive Psychology, Customer service manager and Family Systems  Mental Status/Observations:  Appearance:   Casual and Neat      Behavior:  Appropriate  Motor:  Normal  Speech/Language:   Clear and Coherent  Affect:  Appropriate and Full Range  Mood:  anxious  Thought process:  normal and some quickness to skip on; responsive to redirection  Thought content:    WNL and worries  Sensory/Perceptual disturbances:    WNL  Orientation:  WNL  Attention:  Good  Concentration:  Good  Memory:  WNL notes episodes of concern  Insight:    Good  Judgment:   Good  Impulse Control:  Good   Risk Assessment: Danger to Self:  No Self-injurious Behavior: No Danger to Others: No Duty to Warn:no Physical Aggression / Violence:No  Access to Firearms a concern: No   Diagnosis:   ICD-10-CM   1. Adjustment disorder with mixed anxiety and depressed mood F43.23     Assessment of progress:  improving  Plan:  . Recommendations/advice as noted above . Particularly self-affirm good works refusing the "bait" in conflict, try to manage process with adolescent argumentation from son . Continue to utilize previously learned skills ad lib . Maintain medication as prescribed and work faithfully with relevant prescriber(s) if any changes are desired or seem indicated . Call the clinic on-call service, present to ER, or call 911 if any life-threatening emergency Return at discretion, will call.   Blanchie Serve, PhD [place] Licensed Psychologist

## 2019-08-04 ENCOUNTER — Ambulatory Visit: Payer: Self-pay

## 2019-08-12 ENCOUNTER — Ambulatory Visit: Payer: Medicare Other

## 2019-08-15 ENCOUNTER — Ambulatory Visit: Payer: Self-pay

## 2019-10-22 ENCOUNTER — Other Ambulatory Visit: Payer: Self-pay

## 2019-10-22 ENCOUNTER — Ambulatory Visit (INDEPENDENT_AMBULATORY_CARE_PROVIDER_SITE_OTHER): Payer: Medicare PPO | Admitting: Psychiatry

## 2019-10-22 DIAGNOSIS — F4323 Adjustment disorder with mixed anxiety and depressed mood: Secondary | ICD-10-CM | POA: Diagnosis not present

## 2019-10-22 NOTE — Progress Notes (Signed)
Psychotherapy Progress Note [facility] [name], PhD LP  Patient ID: Shelia Kerr     MRN: [id number] Therapy format: Individual psychotherapy Date: 10/22/2019      Start: 8:10a     Stop: 9:00a     Time Spent: 50 min Location: In-person   Session narrative (presenting needs, interim history, self-report of stressors and symptoms, applications of prior therapy, status changes, and interventions made in session) Afraid she may need to cut ties with daughter, Shelia Kerr.   Father died 09/06/2022, after a long decline, history of obligation and guilting her, softened and more loving more recently, but he developed leaky heart valves and CHF, hospitalized, went into Hospice.  PT shared with Shelia Kerr, let her know it was more urgent than he thought if she wanted to see him, and Shelia Kerr did several things that upset PT's expectations -- posted online when her cousins had not been informed, blew a fuse when PT requested to go in by herself first, had a beautiful moment at the time of his death but abruptly became crabby saying she needed to eat and she didn't want to stay for his body going cold.  Labile episodes afterward, expressing sudden expectations, and explosive irritability with PT, plus a lot of hyperactive, busy, and undue authority-taking behavior along the way.  Ultimately a relief for her to go back [place], but not before being called a bitch at one point and sarcastically being called her father.  Support/empathy provided.  Discussed and reframed as likely temporary regression on daughter's part, she will continue to deal with personality issues in her own space, time, and relationships, and PT's best response is still to let her pass and resist her own urges to make cutting remarks.  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  Grateful for a lot of time living with her autistic  spectrum son, who has had a number of helpful insights, gestures of caring that seem to balance the perceived curse of Shelia Kerr's lability and abrupt narcissistic turns.  In what seems to be karma, ex-H Al was also divorced by Kathlee Nations.    Therapeutic modalities: Cognitive Behavioral Therapy, Solution-Oriented/Positive Psychology and Ego-Supportive  Mental Status/Observations:  Appearance:   Casual     Behavior:  Appropriate  Motor:  Normal  Speech/Language:   Clear and Coherent  Affect:  Appropriate  Mood:  irritated appropriate to subject  Thought process:  normal  Thought content:    WNL  Sensory/Perceptual disturbances:    WNL  Orientation:  Fully oriented  Attention:  Good    Concentration:  Good  Memory:  WNL  Insight:    Good  Judgment:   Good  Impulse Control:  Fair   Risk Assessment: Danger to Self: No Self-injurious Behavior: No Danger to Others: No Physical Aggression / Violence: No Duty to Warn: No Access to Firearms a concern: No  Assessment of progress:  stabilized  Diagnosis:   ICD-10-CM   1. Adjustment disorder with mixed anxiety and depressed mood  F43.23    Plan:  . Resist getting drawn into retributive arguments/debates with Shelia Kerr . Use prior communication tips as needed . Other recommendations/advice as may be noted above . Continue to utilize previously learned skills ad lib . Maintain medication as prescribed and work faithfully with relevant prescriber(s) if any changes are desired or seem indicated . Call the clinic on-call service, present to ER,  or call 911 if any life-threatening psychiatric crisis Return for time as available. . Already scheduled visit in this office Visit date not found.  Blanchie Serve, PhD [name], PhD LP Clinical Psychologist, [facility] [address], [place], [place] [address] [phone]

## 2019-11-12 DIAGNOSIS — Z79899 Other long term (current) drug therapy: Secondary | ICD-10-CM | POA: Diagnosis not present

## 2019-11-12 DIAGNOSIS — Z961 Presence of intraocular lens: Secondary | ICD-10-CM | POA: Diagnosis not present

## 2019-11-12 DIAGNOSIS — H04123 Dry eye syndrome of bilateral lacrimal glands: Secondary | ICD-10-CM | POA: Diagnosis not present

## 2020-01-06 DIAGNOSIS — Z1231 Encounter for screening mammogram for malignant neoplasm of breast: Secondary | ICD-10-CM | POA: Diagnosis not present

## 2020-01-06 DIAGNOSIS — R21 Rash and other nonspecific skin eruption: Secondary | ICD-10-CM | POA: Diagnosis not present

## 2020-01-06 DIAGNOSIS — F419 Anxiety disorder, unspecified: Secondary | ICD-10-CM | POA: Diagnosis not present

## 2020-01-06 DIAGNOSIS — H9193 Unspecified hearing loss, bilateral: Secondary | ICD-10-CM | POA: Diagnosis not present

## 2020-01-06 DIAGNOSIS — L989 Disorder of the skin and subcutaneous tissue, unspecified: Secondary | ICD-10-CM | POA: Diagnosis not present

## 2020-01-26 DIAGNOSIS — H903 Sensorineural hearing loss, bilateral: Secondary | ICD-10-CM | POA: Diagnosis not present

## 2020-02-23 ENCOUNTER — Other Ambulatory Visit: Payer: Self-pay | Admitting: Critical Care Medicine

## 2020-02-23 ENCOUNTER — Other Ambulatory Visit: Payer: Medicare PPO

## 2020-02-23 DIAGNOSIS — Z20822 Contact with and (suspected) exposure to covid-19: Secondary | ICD-10-CM | POA: Diagnosis not present

## 2020-02-24 LAB — NOVEL CORONAVIRUS, NAA: SARS-CoV-2, NAA: NOT DETECTED

## 2020-02-24 LAB — SARS-COV-2, NAA 2 DAY TAT

## 2020-03-15 DIAGNOSIS — M0609 Rheumatoid arthritis without rheumatoid factor, multiple sites: Secondary | ICD-10-CM | POA: Diagnosis not present

## 2020-03-15 DIAGNOSIS — M19042 Primary osteoarthritis, left hand: Secondary | ICD-10-CM | POA: Diagnosis not present

## 2020-03-15 DIAGNOSIS — R5383 Other fatigue: Secondary | ICD-10-CM | POA: Diagnosis not present

## 2020-03-15 DIAGNOSIS — M19041 Primary osteoarthritis, right hand: Secondary | ICD-10-CM | POA: Diagnosis not present

## 2020-06-16 DIAGNOSIS — E785 Hyperlipidemia, unspecified: Secondary | ICD-10-CM | POA: Diagnosis not present

## 2020-06-16 DIAGNOSIS — Z0000 Encounter for general adult medical examination without abnormal findings: Secondary | ICD-10-CM | POA: Diagnosis not present

## 2020-06-16 DIAGNOSIS — F419 Anxiety disorder, unspecified: Secondary | ICD-10-CM | POA: Diagnosis not present

## 2020-06-16 DIAGNOSIS — J301 Allergic rhinitis due to pollen: Secondary | ICD-10-CM | POA: Diagnosis not present

## 2020-06-16 DIAGNOSIS — R5383 Other fatigue: Secondary | ICD-10-CM | POA: Diagnosis not present

## 2020-06-16 DIAGNOSIS — H9193 Unspecified hearing loss, bilateral: Secondary | ICD-10-CM | POA: Diagnosis not present

## 2020-10-17 DIAGNOSIS — H524 Presbyopia: Secondary | ICD-10-CM | POA: Diagnosis not present

## 2020-10-17 DIAGNOSIS — H04123 Dry eye syndrome of bilateral lacrimal glands: Secondary | ICD-10-CM | POA: Diagnosis not present

## 2020-10-17 DIAGNOSIS — H52203 Unspecified astigmatism, bilateral: Secondary | ICD-10-CM | POA: Diagnosis not present

## 2020-10-25 DIAGNOSIS — R0600 Dyspnea, unspecified: Secondary | ICD-10-CM | POA: Diagnosis not present

## 2020-10-25 DIAGNOSIS — J309 Allergic rhinitis, unspecified: Secondary | ICD-10-CM | POA: Diagnosis not present

## 2020-10-25 DIAGNOSIS — J4521 Mild intermittent asthma with (acute) exacerbation: Secondary | ICD-10-CM | POA: Diagnosis not present

## 2020-10-25 DIAGNOSIS — Z20822 Contact with and (suspected) exposure to covid-19: Secondary | ICD-10-CM | POA: Diagnosis not present

## 2020-11-07 DIAGNOSIS — R0600 Dyspnea, unspecified: Secondary | ICD-10-CM | POA: Diagnosis not present

## 2020-11-07 DIAGNOSIS — F419 Anxiety disorder, unspecified: Secondary | ICD-10-CM | POA: Diagnosis not present

## 2020-11-09 ENCOUNTER — Other Ambulatory Visit: Payer: Self-pay | Admitting: Family Medicine

## 2020-11-09 ENCOUNTER — Other Ambulatory Visit: Payer: Self-pay

## 2020-11-09 ENCOUNTER — Ambulatory Visit
Admission: RE | Admit: 2020-11-09 | Discharge: 2020-11-09 | Disposition: A | Discharge status: Home / Self Care | Payer: Medicare PPO | Source: Ambulatory Visit | Attending: Family Medicine | Admitting: Family Medicine

## 2020-11-09 DIAGNOSIS — R0600 Dyspnea, unspecified: Secondary | ICD-10-CM

## 2020-11-09 DIAGNOSIS — R0609 Other forms of dyspnea: Secondary | ICD-10-CM | POA: Diagnosis not present

## 2021-01-01 DIAGNOSIS — M2011 Hallux valgus (acquired), right foot: Secondary | ICD-10-CM | POA: Diagnosis not present

## 2021-01-01 DIAGNOSIS — M24574 Contracture, right foot: Secondary | ICD-10-CM | POA: Diagnosis not present

## 2021-01-01 DIAGNOSIS — M2012 Hallux valgus (acquired), left foot: Secondary | ICD-10-CM | POA: Diagnosis not present

## 2021-01-01 DIAGNOSIS — M7741 Metatarsalgia, right foot: Secondary | ICD-10-CM | POA: Diagnosis not present

## 2021-01-01 DIAGNOSIS — M19071 Primary osteoarthritis, right ankle and foot: Secondary | ICD-10-CM | POA: Diagnosis not present

## 2021-01-17 DIAGNOSIS — Z1231 Encounter for screening mammogram for malignant neoplasm of breast: Secondary | ICD-10-CM | POA: Diagnosis not present

## 2021-02-06 DIAGNOSIS — Z20822 Contact with and (suspected) exposure to covid-19: Secondary | ICD-10-CM | POA: Diagnosis not present

## 2021-02-07 DIAGNOSIS — Z20822 Contact with and (suspected) exposure to covid-19: Secondary | ICD-10-CM | POA: Diagnosis not present

## 2021-03-06 DIAGNOSIS — R519 Headache, unspecified: Secondary | ICD-10-CM | POA: Diagnosis not present

## 2021-03-06 DIAGNOSIS — U071 COVID-19: Secondary | ICD-10-CM | POA: Diagnosis not present

## 2021-03-26 DIAGNOSIS — H04123 Dry eye syndrome of bilateral lacrimal glands: Secondary | ICD-10-CM | POA: Diagnosis not present

## 2021-03-29 DIAGNOSIS — M0609 Rheumatoid arthritis without rheumatoid factor, multiple sites: Secondary | ICD-10-CM | POA: Diagnosis not present

## 2021-03-29 DIAGNOSIS — M19042 Primary osteoarthritis, left hand: Secondary | ICD-10-CM | POA: Diagnosis not present

## 2021-03-29 DIAGNOSIS — M19041 Primary osteoarthritis, right hand: Secondary | ICD-10-CM | POA: Diagnosis not present

## 2021-04-02 DIAGNOSIS — X32XXXA Exposure to sunlight, initial encounter: Secondary | ICD-10-CM | POA: Diagnosis not present

## 2021-04-02 DIAGNOSIS — L814 Other melanin hyperpigmentation: Secondary | ICD-10-CM | POA: Diagnosis not present

## 2021-04-02 DIAGNOSIS — L7211 Pilar cyst: Secondary | ICD-10-CM | POA: Diagnosis not present

## 2021-04-02 DIAGNOSIS — D225 Melanocytic nevi of trunk: Secondary | ICD-10-CM | POA: Diagnosis not present

## 2021-04-02 DIAGNOSIS — L821 Other seborrheic keratosis: Secondary | ICD-10-CM | POA: Diagnosis not present

## 2021-04-30 DIAGNOSIS — N3941 Urge incontinence: Secondary | ICD-10-CM | POA: Diagnosis not present

## 2021-04-30 DIAGNOSIS — N952 Postmenopausal atrophic vaginitis: Secondary | ICD-10-CM | POA: Diagnosis not present

## 2021-04-30 DIAGNOSIS — M7918 Myalgia, other site: Secondary | ICD-10-CM | POA: Diagnosis not present

## 2021-04-30 DIAGNOSIS — N3946 Mixed incontinence: Secondary | ICD-10-CM | POA: Diagnosis not present

## 2021-06-05 DIAGNOSIS — Z90710 Acquired absence of both cervix and uterus: Secondary | ICD-10-CM | POA: Diagnosis not present

## 2021-06-05 DIAGNOSIS — R29898 Other symptoms and signs involving the musculoskeletal system: Secondary | ICD-10-CM | POA: Diagnosis not present

## 2021-06-05 DIAGNOSIS — R300 Dysuria: Secondary | ICD-10-CM | POA: Diagnosis not present

## 2021-06-05 DIAGNOSIS — N3946 Mixed incontinence: Secondary | ICD-10-CM | POA: Diagnosis not present

## 2021-06-05 DIAGNOSIS — M7918 Myalgia, other site: Secondary | ICD-10-CM | POA: Diagnosis not present

## 2021-06-05 DIAGNOSIS — N952 Postmenopausal atrophic vaginitis: Secondary | ICD-10-CM | POA: Diagnosis not present

## 2021-06-26 DIAGNOSIS — R300 Dysuria: Secondary | ICD-10-CM | POA: Diagnosis not present

## 2021-06-26 DIAGNOSIS — N3946 Mixed incontinence: Secondary | ICD-10-CM | POA: Diagnosis not present

## 2021-06-26 DIAGNOSIS — M6289 Other specified disorders of muscle: Secondary | ICD-10-CM | POA: Diagnosis not present

## 2021-07-10 DIAGNOSIS — R300 Dysuria: Secondary | ICD-10-CM | POA: Diagnosis not present

## 2021-07-10 DIAGNOSIS — N3941 Urge incontinence: Secondary | ICD-10-CM | POA: Diagnosis not present

## 2021-07-17 DIAGNOSIS — E2839 Other primary ovarian failure: Secondary | ICD-10-CM | POA: Diagnosis not present

## 2021-07-17 DIAGNOSIS — F419 Anxiety disorder, unspecified: Secondary | ICD-10-CM | POA: Diagnosis not present

## 2021-07-17 DIAGNOSIS — J4521 Mild intermittent asthma with (acute) exacerbation: Secondary | ICD-10-CM | POA: Diagnosis not present

## 2021-07-17 DIAGNOSIS — Z0000 Encounter for general adult medical examination without abnormal findings: Secondary | ICD-10-CM | POA: Diagnosis not present

## 2021-07-17 DIAGNOSIS — R202 Paresthesia of skin: Secondary | ICD-10-CM | POA: Diagnosis not present

## 2021-07-17 DIAGNOSIS — E785 Hyperlipidemia, unspecified: Secondary | ICD-10-CM | POA: Diagnosis not present

## 2021-07-17 DIAGNOSIS — J309 Allergic rhinitis, unspecified: Secondary | ICD-10-CM | POA: Diagnosis not present

## 2021-07-17 DIAGNOSIS — H6123 Impacted cerumen, bilateral: Secondary | ICD-10-CM | POA: Diagnosis not present

## 2021-07-24 DIAGNOSIS — M6289 Other specified disorders of muscle: Secondary | ICD-10-CM | POA: Diagnosis not present

## 2021-07-24 DIAGNOSIS — R300 Dysuria: Secondary | ICD-10-CM | POA: Diagnosis not present

## 2021-07-24 DIAGNOSIS — R278 Other lack of coordination: Secondary | ICD-10-CM | POA: Diagnosis not present

## 2021-07-24 DIAGNOSIS — N3941 Urge incontinence: Secondary | ICD-10-CM | POA: Diagnosis not present

## 2021-07-24 DIAGNOSIS — R351 Nocturia: Secondary | ICD-10-CM | POA: Diagnosis not present

## 2021-07-24 DIAGNOSIS — N952 Postmenopausal atrophic vaginitis: Secondary | ICD-10-CM | POA: Diagnosis not present

## 2021-07-30 DIAGNOSIS — M7918 Myalgia, other site: Secondary | ICD-10-CM | POA: Diagnosis not present

## 2021-07-30 DIAGNOSIS — N3941 Urge incontinence: Secondary | ICD-10-CM | POA: Diagnosis not present

## 2021-08-14 DIAGNOSIS — E875 Hyperkalemia: Secondary | ICD-10-CM | POA: Diagnosis not present

## 2021-08-15 DIAGNOSIS — R300 Dysuria: Secondary | ICD-10-CM | POA: Diagnosis not present

## 2021-08-15 DIAGNOSIS — N3941 Urge incontinence: Secondary | ICD-10-CM | POA: Diagnosis not present

## 2021-08-23 DIAGNOSIS — M19042 Primary osteoarthritis, left hand: Secondary | ICD-10-CM | POA: Diagnosis not present

## 2021-08-23 DIAGNOSIS — H6121 Impacted cerumen, right ear: Secondary | ICD-10-CM | POA: Diagnosis not present

## 2021-08-23 DIAGNOSIS — B001 Herpesviral vesicular dermatitis: Secondary | ICD-10-CM | POA: Diagnosis not present

## 2021-08-23 DIAGNOSIS — M79641 Pain in right hand: Secondary | ICD-10-CM | POA: Diagnosis not present

## 2021-08-23 DIAGNOSIS — E538 Deficiency of other specified B group vitamins: Secondary | ICD-10-CM | POA: Diagnosis not present

## 2021-08-23 DIAGNOSIS — M19041 Primary osteoarthritis, right hand: Secondary | ICD-10-CM | POA: Diagnosis not present

## 2021-08-23 DIAGNOSIS — M79642 Pain in left hand: Secondary | ICD-10-CM | POA: Diagnosis not present

## 2021-08-30 DIAGNOSIS — M8589 Other specified disorders of bone density and structure, multiple sites: Secondary | ICD-10-CM | POA: Diagnosis not present

## 2021-08-30 DIAGNOSIS — Z780 Asymptomatic menopausal state: Secondary | ICD-10-CM | POA: Diagnosis not present

## 2021-09-27 IMAGING — CR DG CHEST 2V
2 series · 2 of 2 positions shown · non-contrast
Comparison: 01/13/2018

CLINICAL DATA: Dyspnea on exertion

EXAM:
CHEST - 2 VIEW

[w chest pa]
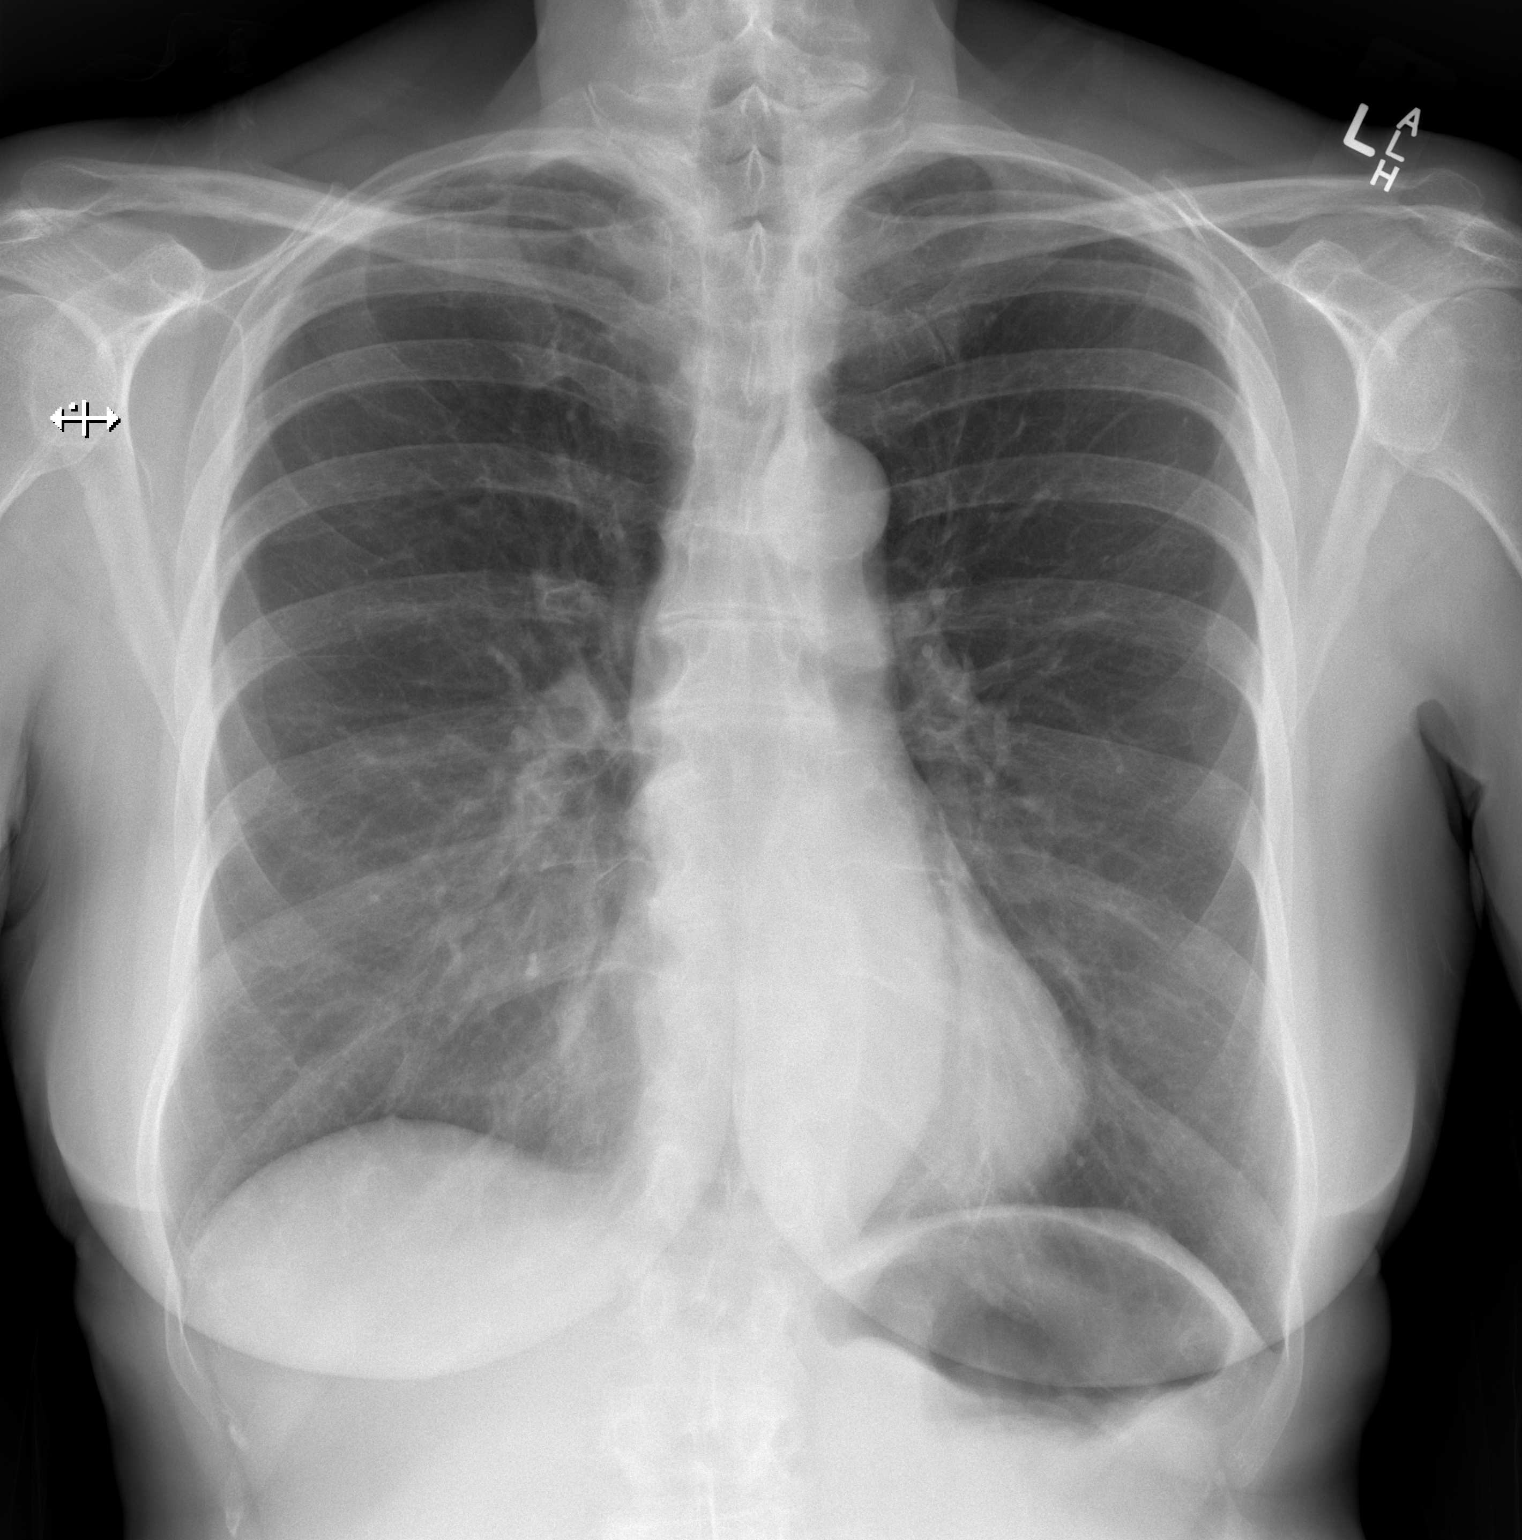

[w chest lat]
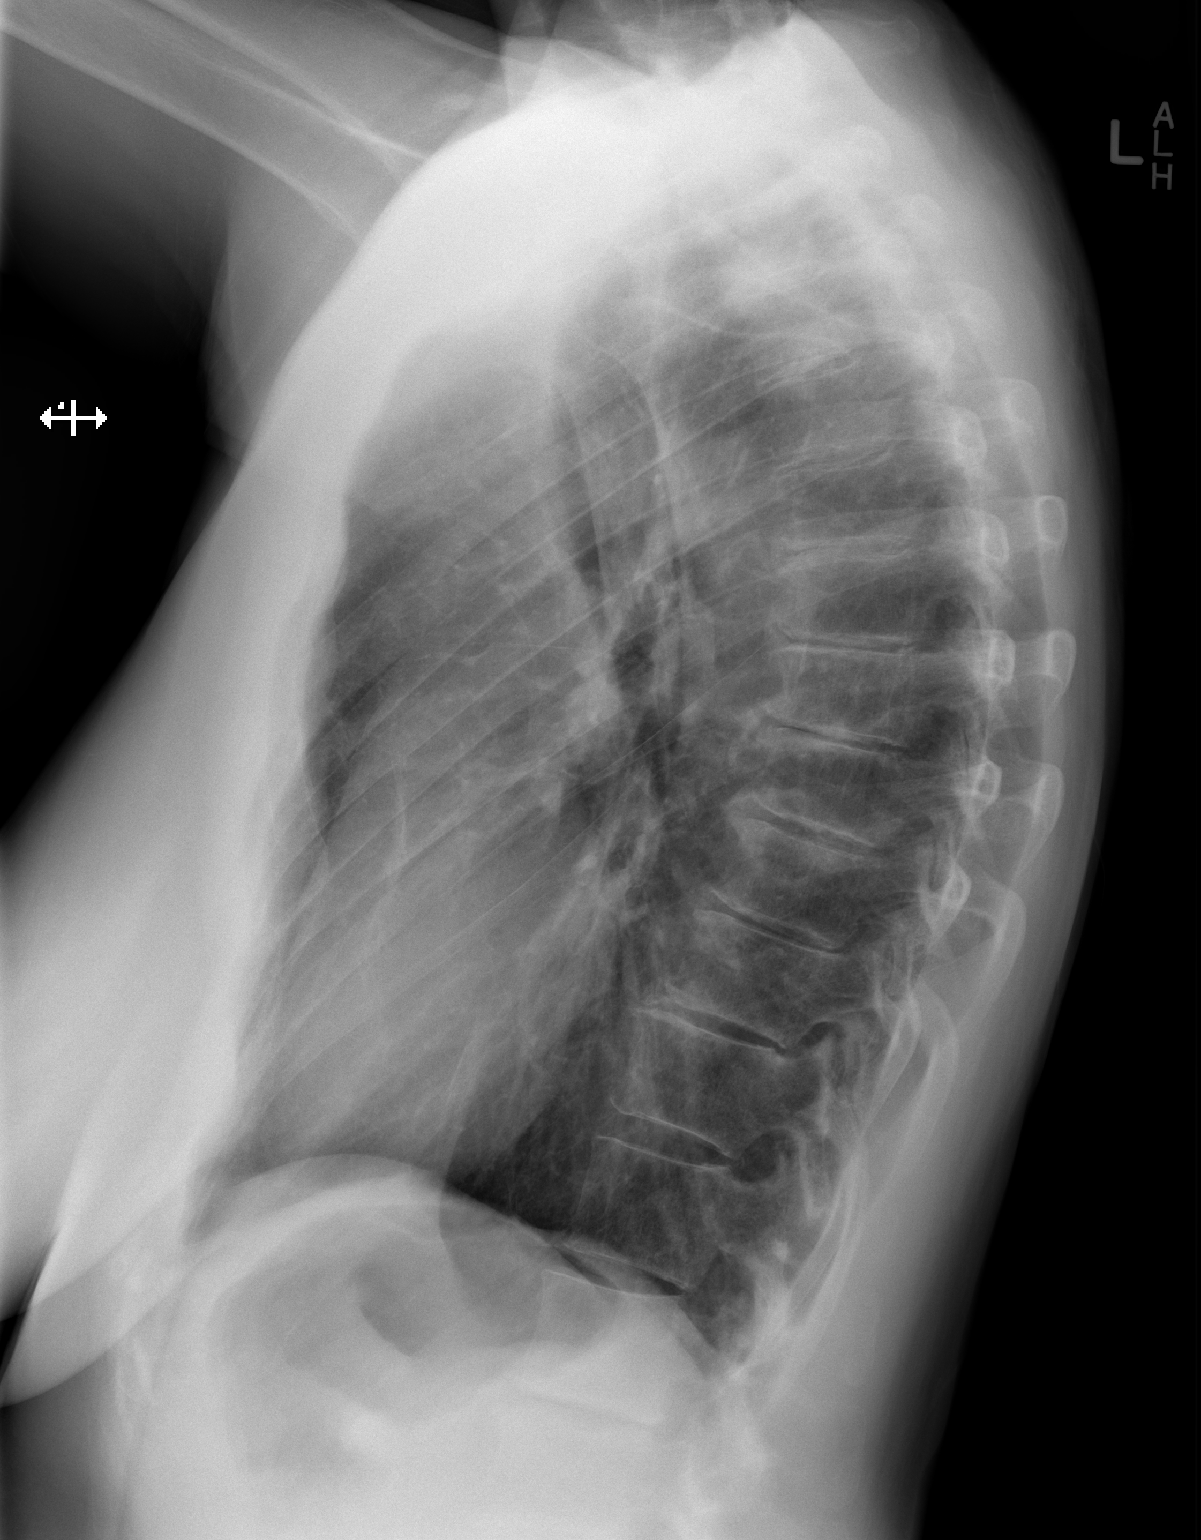

[2 of 2 positions shown; findings below may reference images not displayed]

FINDINGS: The heart size and mediastinal contours are within normal limits.
Both lungs are clear. The visualized skeletal structures are
unremarkable.
IMPRESSION: No active cardiopulmonary disease.

## 2021-10-01 DIAGNOSIS — M19041 Primary osteoarthritis, right hand: Secondary | ICD-10-CM | POA: Diagnosis not present

## 2021-10-01 DIAGNOSIS — M19042 Primary osteoarthritis, left hand: Secondary | ICD-10-CM | POA: Diagnosis not present

## 2021-10-23 DIAGNOSIS — H52203 Unspecified astigmatism, bilateral: Secondary | ICD-10-CM | POA: Diagnosis not present

## 2021-10-23 DIAGNOSIS — H04123 Dry eye syndrome of bilateral lacrimal glands: Secondary | ICD-10-CM | POA: Diagnosis not present

## 2021-10-23 DIAGNOSIS — H26493 Other secondary cataract, bilateral: Secondary | ICD-10-CM | POA: Diagnosis not present

## 2021-10-23 DIAGNOSIS — H43813 Vitreous degeneration, bilateral: Secondary | ICD-10-CM | POA: Diagnosis not present

## 2021-11-12 DIAGNOSIS — H938X1 Other specified disorders of right ear: Secondary | ICD-10-CM | POA: Diagnosis not present

## 2021-11-12 DIAGNOSIS — R42 Dizziness and giddiness: Secondary | ICD-10-CM | POA: Diagnosis not present

## 2021-11-12 DIAGNOSIS — K219 Gastro-esophageal reflux disease without esophagitis: Secondary | ICD-10-CM | POA: Diagnosis not present

## 2021-11-12 DIAGNOSIS — J309 Allergic rhinitis, unspecified: Secondary | ICD-10-CM | POA: Diagnosis not present

## 2021-11-12 DIAGNOSIS — R202 Paresthesia of skin: Secondary | ICD-10-CM | POA: Diagnosis not present

## 2021-11-12 DIAGNOSIS — J4521 Mild intermittent asthma with (acute) exacerbation: Secondary | ICD-10-CM | POA: Diagnosis not present

## 2022-01-14 DIAGNOSIS — F419 Anxiety disorder, unspecified: Secondary | ICD-10-CM | POA: Diagnosis not present

## 2022-01-14 DIAGNOSIS — E538 Deficiency of other specified B group vitamins: Secondary | ICD-10-CM | POA: Diagnosis not present

## 2022-01-14 DIAGNOSIS — J301 Allergic rhinitis due to pollen: Secondary | ICD-10-CM | POA: Diagnosis not present

## 2022-01-14 DIAGNOSIS — R5383 Other fatigue: Secondary | ICD-10-CM | POA: Diagnosis not present

## 2022-01-14 DIAGNOSIS — J4521 Mild intermittent asthma with (acute) exacerbation: Secondary | ICD-10-CM | POA: Diagnosis not present

## 2022-01-14 DIAGNOSIS — R202 Paresthesia of skin: Secondary | ICD-10-CM | POA: Diagnosis not present

## 2022-01-14 DIAGNOSIS — R7303 Prediabetes: Secondary | ICD-10-CM | POA: Diagnosis not present

## 2022-01-14 DIAGNOSIS — M19041 Primary osteoarthritis, right hand: Secondary | ICD-10-CM | POA: Diagnosis not present

## 2022-01-29 DIAGNOSIS — Z1231 Encounter for screening mammogram for malignant neoplasm of breast: Secondary | ICD-10-CM | POA: Diagnosis not present

## 2022-04-09 DIAGNOSIS — M19042 Primary osteoarthritis, left hand: Secondary | ICD-10-CM | POA: Diagnosis not present

## 2022-04-09 DIAGNOSIS — M19041 Primary osteoarthritis, right hand: Secondary | ICD-10-CM | POA: Diagnosis not present

## 2022-05-06 DIAGNOSIS — R5383 Other fatigue: Secondary | ICD-10-CM | POA: Diagnosis not present

## 2022-05-06 DIAGNOSIS — F419 Anxiety disorder, unspecified: Secondary | ICD-10-CM | POA: Diagnosis not present

## 2022-06-19 DIAGNOSIS — M180 Bilateral primary osteoarthritis of first carpometacarpal joints: Secondary | ICD-10-CM | POA: Diagnosis not present

## 2022-06-19 DIAGNOSIS — M79642 Pain in left hand: Secondary | ICD-10-CM | POA: Diagnosis not present

## 2022-06-19 DIAGNOSIS — M79641 Pain in right hand: Secondary | ICD-10-CM | POA: Diagnosis not present

## 2022-06-19 DIAGNOSIS — G5603 Carpal tunnel syndrome, bilateral upper limbs: Secondary | ICD-10-CM | POA: Diagnosis not present

## 2022-06-19 DIAGNOSIS — M25521 Pain in right elbow: Secondary | ICD-10-CM | POA: Diagnosis not present

## 2022-07-16 DIAGNOSIS — G5603 Carpal tunnel syndrome, bilateral upper limbs: Secondary | ICD-10-CM | POA: Diagnosis not present

## 2022-08-02 DIAGNOSIS — R413 Other amnesia: Secondary | ICD-10-CM | POA: Diagnosis not present

## 2022-08-02 DIAGNOSIS — F419 Anxiety disorder, unspecified: Secondary | ICD-10-CM | POA: Diagnosis not present

## 2022-08-02 DIAGNOSIS — E785 Hyperlipidemia, unspecified: Secondary | ICD-10-CM | POA: Diagnosis not present

## 2022-08-02 DIAGNOSIS — Z8619 Personal history of other infectious and parasitic diseases: Secondary | ICD-10-CM | POA: Diagnosis not present

## 2022-08-02 DIAGNOSIS — E538 Deficiency of other specified B group vitamins: Secondary | ICD-10-CM | POA: Diagnosis not present

## 2022-08-02 DIAGNOSIS — J301 Allergic rhinitis due to pollen: Secondary | ICD-10-CM | POA: Diagnosis not present

## 2022-08-02 DIAGNOSIS — E049 Nontoxic goiter, unspecified: Secondary | ICD-10-CM | POA: Diagnosis not present

## 2022-08-02 DIAGNOSIS — Z0000 Encounter for general adult medical examination without abnormal findings: Secondary | ICD-10-CM | POA: Diagnosis not present

## 2022-08-02 DIAGNOSIS — R7303 Prediabetes: Secondary | ICD-10-CM | POA: Diagnosis not present

## 2022-08-02 DIAGNOSIS — J4520 Mild intermittent asthma, uncomplicated: Secondary | ICD-10-CM | POA: Diagnosis not present

## 2022-08-07 DIAGNOSIS — G5603 Carpal tunnel syndrome, bilateral upper limbs: Secondary | ICD-10-CM | POA: Diagnosis not present

## 2022-08-07 DIAGNOSIS — M180 Bilateral primary osteoarthritis of first carpometacarpal joints: Secondary | ICD-10-CM | POA: Diagnosis not present

## 2022-08-14 DIAGNOSIS — G5603 Carpal tunnel syndrome, bilateral upper limbs: Secondary | ICD-10-CM | POA: Diagnosis not present

## 2022-08-14 DIAGNOSIS — M180 Bilateral primary osteoarthritis of first carpometacarpal joints: Secondary | ICD-10-CM | POA: Diagnosis not present

## 2022-08-14 DIAGNOSIS — M19041 Primary osteoarthritis, right hand: Secondary | ICD-10-CM | POA: Diagnosis not present

## 2022-08-28 DIAGNOSIS — H90A31 Mixed conductive and sensorineural hearing loss, unilateral, right ear with restricted hearing on the contralateral side: Secondary | ICD-10-CM | POA: Diagnosis not present

## 2022-09-24 DIAGNOSIS — R430 Anosmia: Secondary | ICD-10-CM | POA: Diagnosis not present

## 2022-09-24 DIAGNOSIS — H9011 Conductive hearing loss, unilateral, right ear, with unrestricted hearing on the contralateral side: Secondary | ICD-10-CM | POA: Diagnosis not present

## 2022-10-14 DIAGNOSIS — E559 Vitamin D deficiency, unspecified: Secondary | ICD-10-CM | POA: Diagnosis not present

## 2022-10-14 DIAGNOSIS — M19041 Primary osteoarthritis, right hand: Secondary | ICD-10-CM | POA: Diagnosis not present

## 2022-10-14 DIAGNOSIS — M19042 Primary osteoarthritis, left hand: Secondary | ICD-10-CM | POA: Diagnosis not present

## 2022-10-14 DIAGNOSIS — Z8739 Personal history of other diseases of the musculoskeletal system and connective tissue: Secondary | ICD-10-CM | POA: Diagnosis not present

## 2022-10-30 DIAGNOSIS — H52203 Unspecified astigmatism, bilateral: Secondary | ICD-10-CM | POA: Diagnosis not present

## 2022-10-30 DIAGNOSIS — H0100A Unspecified blepharitis right eye, upper and lower eyelids: Secondary | ICD-10-CM | POA: Diagnosis not present

## 2022-10-30 DIAGNOSIS — H0100B Unspecified blepharitis left eye, upper and lower eyelids: Secondary | ICD-10-CM | POA: Diagnosis not present

## 2022-10-30 DIAGNOSIS — H04123 Dry eye syndrome of bilateral lacrimal glands: Secondary | ICD-10-CM | POA: Diagnosis not present

## 2022-10-30 DIAGNOSIS — H43813 Vitreous degeneration, bilateral: Secondary | ICD-10-CM | POA: Diagnosis not present

## 2022-10-30 DIAGNOSIS — H26493 Other secondary cataract, bilateral: Secondary | ICD-10-CM | POA: Diagnosis not present

## 2022-10-30 DIAGNOSIS — H524 Presbyopia: Secondary | ICD-10-CM | POA: Diagnosis not present

## 2022-11-05 DIAGNOSIS — H26492 Other secondary cataract, left eye: Secondary | ICD-10-CM | POA: Diagnosis not present

## 2023-01-31 DIAGNOSIS — M19041 Primary osteoarthritis, right hand: Secondary | ICD-10-CM | POA: Diagnosis not present

## 2023-01-31 DIAGNOSIS — J4520 Mild intermittent asthma, uncomplicated: Secondary | ICD-10-CM | POA: Diagnosis not present

## 2023-01-31 DIAGNOSIS — F419 Anxiety disorder, unspecified: Secondary | ICD-10-CM | POA: Diagnosis not present

## 2023-01-31 DIAGNOSIS — M19042 Primary osteoarthritis, left hand: Secondary | ICD-10-CM | POA: Diagnosis not present

## 2023-02-28 DIAGNOSIS — Z1231 Encounter for screening mammogram for malignant neoplasm of breast: Secondary | ICD-10-CM | POA: Diagnosis not present

## 2023-03-13 DIAGNOSIS — N958 Other specified menopausal and perimenopausal disorders: Secondary | ICD-10-CM | POA: Diagnosis not present

## 2023-03-13 DIAGNOSIS — N3946 Mixed incontinence: Secondary | ICD-10-CM | POA: Diagnosis not present

## 2023-03-13 DIAGNOSIS — M7918 Myalgia, other site: Secondary | ICD-10-CM | POA: Diagnosis not present

## 2023-03-13 DIAGNOSIS — N3941 Urge incontinence: Secondary | ICD-10-CM | POA: Diagnosis not present

## 2023-03-13 DIAGNOSIS — N952 Postmenopausal atrophic vaginitis: Secondary | ICD-10-CM | POA: Diagnosis not present

## 2023-05-05 DIAGNOSIS — L988 Other specified disorders of the skin and subcutaneous tissue: Secondary | ICD-10-CM | POA: Diagnosis not present

## 2023-05-05 DIAGNOSIS — Z8619 Personal history of other infectious and parasitic diseases: Secondary | ICD-10-CM | POA: Diagnosis not present

## 2023-05-05 DIAGNOSIS — R208 Other disturbances of skin sensation: Secondary | ICD-10-CM | POA: Diagnosis not present

## 2023-05-05 DIAGNOSIS — L728 Other follicular cysts of the skin and subcutaneous tissue: Secondary | ICD-10-CM | POA: Diagnosis not present

## 2023-05-05 DIAGNOSIS — L814 Other melanin hyperpigmentation: Secondary | ICD-10-CM | POA: Diagnosis not present

## 2023-05-05 DIAGNOSIS — D235 Other benign neoplasm of skin of trunk: Secondary | ICD-10-CM | POA: Diagnosis not present

## 2023-05-05 DIAGNOSIS — L821 Other seborrheic keratosis: Secondary | ICD-10-CM | POA: Diagnosis not present

## 2023-05-08 DIAGNOSIS — G5603 Carpal tunnel syndrome, bilateral upper limbs: Secondary | ICD-10-CM | POA: Diagnosis not present

## 2023-05-08 DIAGNOSIS — M1812 Unilateral primary osteoarthritis of first carpometacarpal joint, left hand: Secondary | ICD-10-CM | POA: Diagnosis not present

## 2023-05-08 DIAGNOSIS — E538 Deficiency of other specified B group vitamins: Secondary | ICD-10-CM | POA: Diagnosis not present

## 2023-05-08 DIAGNOSIS — M25559 Pain in unspecified hip: Secondary | ICD-10-CM | POA: Diagnosis not present

## 2023-05-08 DIAGNOSIS — R61 Generalized hyperhidrosis: Secondary | ICD-10-CM | POA: Diagnosis not present

## 2023-05-08 DIAGNOSIS — R7303 Prediabetes: Secondary | ICD-10-CM | POA: Diagnosis not present

## 2023-05-08 DIAGNOSIS — M2550 Pain in unspecified joint: Secondary | ICD-10-CM | POA: Diagnosis not present

## 2023-05-21 DIAGNOSIS — G5601 Carpal tunnel syndrome, right upper limb: Secondary | ICD-10-CM | POA: Diagnosis not present

## 2023-05-21 DIAGNOSIS — M1811 Unilateral primary osteoarthritis of first carpometacarpal joint, right hand: Secondary | ICD-10-CM | POA: Diagnosis not present

## 2023-05-21 DIAGNOSIS — M79641 Pain in right hand: Secondary | ICD-10-CM | POA: Diagnosis not present

## 2023-05-22 DIAGNOSIS — Z9889 Other specified postprocedural states: Secondary | ICD-10-CM | POA: Diagnosis not present

## 2023-05-22 DIAGNOSIS — M25552 Pain in left hip: Secondary | ICD-10-CM | POA: Diagnosis not present

## 2023-05-22 DIAGNOSIS — N3946 Mixed incontinence: Secondary | ICD-10-CM | POA: Diagnosis not present

## 2023-05-22 DIAGNOSIS — M7918 Myalgia, other site: Secondary | ICD-10-CM | POA: Diagnosis not present

## 2023-05-22 DIAGNOSIS — R29898 Other symptoms and signs involving the musculoskeletal system: Secondary | ICD-10-CM | POA: Diagnosis not present

## 2023-05-22 DIAGNOSIS — M25551 Pain in right hip: Secondary | ICD-10-CM | POA: Diagnosis not present

## 2023-05-27 DIAGNOSIS — M160 Bilateral primary osteoarthritis of hip: Secondary | ICD-10-CM | POA: Diagnosis not present

## 2023-05-27 DIAGNOSIS — M25552 Pain in left hip: Secondary | ICD-10-CM | POA: Diagnosis not present

## 2023-05-27 DIAGNOSIS — M7062 Trochanteric bursitis, left hip: Secondary | ICD-10-CM | POA: Diagnosis not present

## 2023-05-27 DIAGNOSIS — M25551 Pain in right hip: Secondary | ICD-10-CM | POA: Diagnosis not present

## 2023-05-27 DIAGNOSIS — M7061 Trochanteric bursitis, right hip: Secondary | ICD-10-CM | POA: Diagnosis not present

## 2023-06-10 DIAGNOSIS — M7062 Trochanteric bursitis, left hip: Secondary | ICD-10-CM | POA: Diagnosis not present

## 2023-06-10 DIAGNOSIS — M7061 Trochanteric bursitis, right hip: Secondary | ICD-10-CM | POA: Diagnosis not present

## 2023-06-11 DIAGNOSIS — I440 Atrioventricular block, first degree: Secondary | ICD-10-CM | POA: Diagnosis not present

## 2023-06-11 DIAGNOSIS — R002 Palpitations: Secondary | ICD-10-CM | POA: Diagnosis not present

## 2023-06-11 DIAGNOSIS — R0602 Shortness of breath: Secondary | ICD-10-CM | POA: Diagnosis not present

## 2023-06-11 DIAGNOSIS — Z7689 Persons encountering health services in other specified circumstances: Secondary | ICD-10-CM | POA: Diagnosis not present

## 2023-06-12 DIAGNOSIS — M25552 Pain in left hip: Secondary | ICD-10-CM | POA: Diagnosis not present

## 2023-06-12 DIAGNOSIS — M25551 Pain in right hip: Secondary | ICD-10-CM | POA: Diagnosis not present

## 2023-06-16 DIAGNOSIS — M7061 Trochanteric bursitis, right hip: Secondary | ICD-10-CM | POA: Diagnosis not present

## 2023-06-16 DIAGNOSIS — M7062 Trochanteric bursitis, left hip: Secondary | ICD-10-CM | POA: Diagnosis not present

## 2023-06-23 DIAGNOSIS — B9689 Other specified bacterial agents as the cause of diseases classified elsewhere: Secondary | ICD-10-CM | POA: Diagnosis not present

## 2023-06-23 DIAGNOSIS — J208 Acute bronchitis due to other specified organisms: Secondary | ICD-10-CM | POA: Diagnosis not present

## 2023-06-29 DIAGNOSIS — J22 Unspecified acute lower respiratory infection: Secondary | ICD-10-CM | POA: Diagnosis not present

## 2023-06-29 DIAGNOSIS — R5383 Other fatigue: Secondary | ICD-10-CM | POA: Diagnosis not present

## 2023-06-29 DIAGNOSIS — R059 Cough, unspecified: Secondary | ICD-10-CM | POA: Diagnosis not present

## 2023-07-08 DIAGNOSIS — M25552 Pain in left hip: Secondary | ICD-10-CM | POA: Diagnosis not present

## 2023-07-08 DIAGNOSIS — M25551 Pain in right hip: Secondary | ICD-10-CM | POA: Diagnosis not present

## 2023-07-10 DIAGNOSIS — M7061 Trochanteric bursitis, right hip: Secondary | ICD-10-CM | POA: Diagnosis not present

## 2023-07-10 DIAGNOSIS — M7062 Trochanteric bursitis, left hip: Secondary | ICD-10-CM | POA: Diagnosis not present

## 2023-07-11 DIAGNOSIS — Z7689 Persons encountering health services in other specified circumstances: Secondary | ICD-10-CM | POA: Diagnosis not present

## 2023-07-11 DIAGNOSIS — R002 Palpitations: Secondary | ICD-10-CM | POA: Diagnosis not present

## 2023-07-11 DIAGNOSIS — R0602 Shortness of breath: Secondary | ICD-10-CM | POA: Diagnosis not present

## 2023-07-15 DIAGNOSIS — M7062 Trochanteric bursitis, left hip: Secondary | ICD-10-CM | POA: Diagnosis not present

## 2023-07-15 DIAGNOSIS — M7061 Trochanteric bursitis, right hip: Secondary | ICD-10-CM | POA: Diagnosis not present

## 2023-07-18 DIAGNOSIS — M19041 Primary osteoarthritis, right hand: Secondary | ICD-10-CM | POA: Diagnosis not present

## 2023-07-18 DIAGNOSIS — G5601 Carpal tunnel syndrome, right upper limb: Secondary | ICD-10-CM | POA: Diagnosis not present

## 2023-07-21 DIAGNOSIS — Z7689 Persons encountering health services in other specified circumstances: Secondary | ICD-10-CM | POA: Diagnosis not present

## 2023-07-21 DIAGNOSIS — R0602 Shortness of breath: Secondary | ICD-10-CM | POA: Diagnosis not present

## 2023-07-23 DIAGNOSIS — G5601 Carpal tunnel syndrome, right upper limb: Secondary | ICD-10-CM | POA: Diagnosis not present

## 2023-07-29 DIAGNOSIS — M25552 Pain in left hip: Secondary | ICD-10-CM | POA: Diagnosis not present

## 2023-07-29 DIAGNOSIS — M25551 Pain in right hip: Secondary | ICD-10-CM | POA: Diagnosis not present

## 2023-07-30 DIAGNOSIS — M1812 Unilateral primary osteoarthritis of first carpometacarpal joint, left hand: Secondary | ICD-10-CM | POA: Diagnosis not present

## 2023-07-30 DIAGNOSIS — M79642 Pain in left hand: Secondary | ICD-10-CM | POA: Diagnosis not present

## 2023-08-06 DIAGNOSIS — G5601 Carpal tunnel syndrome, right upper limb: Secondary | ICD-10-CM | POA: Diagnosis not present

## 2023-08-08 DIAGNOSIS — M19041 Primary osteoarthritis, right hand: Secondary | ICD-10-CM | POA: Diagnosis not present

## 2023-08-08 DIAGNOSIS — F419 Anxiety disorder, unspecified: Secondary | ICD-10-CM | POA: Diagnosis not present

## 2023-08-08 DIAGNOSIS — M8588 Other specified disorders of bone density and structure, other site: Secondary | ICD-10-CM | POA: Diagnosis not present

## 2023-08-08 DIAGNOSIS — J4520 Mild intermittent asthma, uncomplicated: Secondary | ICD-10-CM | POA: Diagnosis not present

## 2023-08-08 DIAGNOSIS — J301 Allergic rhinitis due to pollen: Secondary | ICD-10-CM | POA: Diagnosis not present

## 2023-08-08 DIAGNOSIS — E538 Deficiency of other specified B group vitamins: Secondary | ICD-10-CM | POA: Diagnosis not present

## 2023-08-08 DIAGNOSIS — R7303 Prediabetes: Secondary | ICD-10-CM | POA: Diagnosis not present

## 2023-08-08 DIAGNOSIS — Z23 Encounter for immunization: Secondary | ICD-10-CM | POA: Diagnosis not present

## 2023-08-08 DIAGNOSIS — Z0000 Encounter for general adult medical examination without abnormal findings: Secondary | ICD-10-CM | POA: Diagnosis not present

## 2023-08-08 DIAGNOSIS — E785 Hyperlipidemia, unspecified: Secondary | ICD-10-CM | POA: Diagnosis not present

## 2023-08-08 DIAGNOSIS — K219 Gastro-esophageal reflux disease without esophagitis: Secondary | ICD-10-CM | POA: Diagnosis not present

## 2023-09-04 DIAGNOSIS — N3941 Urge incontinence: Secondary | ICD-10-CM | POA: Diagnosis not present

## 2023-10-06 DIAGNOSIS — M1811 Unilateral primary osteoarthritis of first carpometacarpal joint, right hand: Secondary | ICD-10-CM | POA: Diagnosis not present

## 2023-10-06 DIAGNOSIS — S63071A Subluxation of distal end of right ulna, initial encounter: Secondary | ICD-10-CM | POA: Diagnosis not present

## 2023-10-06 DIAGNOSIS — G5601 Carpal tunnel syndrome, right upper limb: Secondary | ICD-10-CM | POA: Diagnosis not present

## 2023-10-06 DIAGNOSIS — N819 Female genital prolapse, unspecified: Secondary | ICD-10-CM | POA: Diagnosis not present

## 2023-10-06 DIAGNOSIS — N3941 Urge incontinence: Secondary | ICD-10-CM | POA: Diagnosis not present

## 2023-10-07 DIAGNOSIS — R002 Palpitations: Secondary | ICD-10-CM | POA: Diagnosis not present

## 2023-10-13 DIAGNOSIS — M19031 Primary osteoarthritis, right wrist: Secondary | ICD-10-CM | POA: Diagnosis not present

## 2023-10-13 DIAGNOSIS — M1811 Unilateral primary osteoarthritis of first carpometacarpal joint, right hand: Secondary | ICD-10-CM | POA: Diagnosis not present

## 2023-10-13 DIAGNOSIS — G5601 Carpal tunnel syndrome, right upper limb: Secondary | ICD-10-CM | POA: Diagnosis not present

## 2023-11-20 DIAGNOSIS — H01004 Unspecified blepharitis left upper eyelid: Secondary | ICD-10-CM | POA: Diagnosis not present

## 2023-11-20 DIAGNOSIS — H18593 Other hereditary corneal dystrophies, bilateral: Secondary | ICD-10-CM | POA: Diagnosis not present

## 2023-11-20 DIAGNOSIS — H0100A Unspecified blepharitis right eye, upper and lower eyelids: Secondary | ICD-10-CM | POA: Diagnosis not present

## 2023-11-20 DIAGNOSIS — H43813 Vitreous degeneration, bilateral: Secondary | ICD-10-CM | POA: Diagnosis not present

## 2023-11-20 DIAGNOSIS — H52203 Unspecified astigmatism, bilateral: Secondary | ICD-10-CM | POA: Diagnosis not present

## 2023-11-20 DIAGNOSIS — H26491 Other secondary cataract, right eye: Secondary | ICD-10-CM | POA: Diagnosis not present

## 2023-11-20 DIAGNOSIS — H524 Presbyopia: Secondary | ICD-10-CM | POA: Diagnosis not present

## 2023-11-20 DIAGNOSIS — H04123 Dry eye syndrome of bilateral lacrimal glands: Secondary | ICD-10-CM | POA: Diagnosis not present

## 2023-12-04 DIAGNOSIS — M2042 Other hammer toe(s) (acquired), left foot: Secondary | ICD-10-CM | POA: Diagnosis not present

## 2023-12-04 DIAGNOSIS — M2041 Other hammer toe(s) (acquired), right foot: Secondary | ICD-10-CM | POA: Diagnosis not present

## 2023-12-24 DIAGNOSIS — G5601 Carpal tunnel syndrome, right upper limb: Secondary | ICD-10-CM | POA: Diagnosis not present

## 2023-12-24 DIAGNOSIS — M180 Bilateral primary osteoarthritis of first carpometacarpal joints: Secondary | ICD-10-CM | POA: Diagnosis not present

## 2023-12-24 DIAGNOSIS — M24131 Other articular cartilage disorders, right wrist: Secondary | ICD-10-CM | POA: Diagnosis not present

## 2023-12-31 DIAGNOSIS — N819 Female genital prolapse, unspecified: Secondary | ICD-10-CM | POA: Diagnosis not present

## 2023-12-31 DIAGNOSIS — N3941 Urge incontinence: Secondary | ICD-10-CM | POA: Diagnosis not present

## 2024-01-30 DIAGNOSIS — F419 Anxiety disorder, unspecified: Secondary | ICD-10-CM | POA: Diagnosis not present

## 2024-01-30 DIAGNOSIS — J4520 Mild intermittent asthma, uncomplicated: Secondary | ICD-10-CM | POA: Diagnosis not present

## 2024-01-30 DIAGNOSIS — Z7184 Encounter for health counseling related to travel: Secondary | ICD-10-CM | POA: Diagnosis not present

## 2024-01-30 DIAGNOSIS — R7303 Prediabetes: Secondary | ICD-10-CM | POA: Diagnosis not present

## 2024-01-30 DIAGNOSIS — R350 Frequency of micturition: Secondary | ICD-10-CM | POA: Diagnosis not present

## 2024-01-30 DIAGNOSIS — M25531 Pain in right wrist: Secondary | ICD-10-CM | POA: Diagnosis not present

## 2024-01-30 DIAGNOSIS — Z23 Encounter for immunization: Secondary | ICD-10-CM | POA: Diagnosis not present

## 2024-01-30 DIAGNOSIS — J301 Allergic rhinitis due to pollen: Secondary | ICD-10-CM | POA: Diagnosis not present

## 2024-01-30 DIAGNOSIS — Z791 Long term (current) use of non-steroidal anti-inflammatories (NSAID): Secondary | ICD-10-CM | POA: Diagnosis not present

## 2024-02-04 DIAGNOSIS — N952 Postmenopausal atrophic vaginitis: Secondary | ICD-10-CM | POA: Diagnosis not present

## 2024-02-04 DIAGNOSIS — N958 Other specified menopausal and perimenopausal disorders: Secondary | ICD-10-CM | POA: Diagnosis not present

## 2024-02-04 DIAGNOSIS — N3941 Urge incontinence: Secondary | ICD-10-CM | POA: Diagnosis not present

## 2024-02-04 DIAGNOSIS — M7918 Myalgia, other site: Secondary | ICD-10-CM | POA: Diagnosis not present

## 2024-02-04 DIAGNOSIS — N819 Female genital prolapse, unspecified: Secondary | ICD-10-CM | POA: Diagnosis not present

## 2024-02-04 DIAGNOSIS — N3946 Mixed incontinence: Secondary | ICD-10-CM | POA: Diagnosis not present

## 2024-02-18 DIAGNOSIS — J4520 Mild intermittent asthma, uncomplicated: Secondary | ICD-10-CM | POA: Diagnosis not present

## 2024-02-18 DIAGNOSIS — U071 COVID-19: Secondary | ICD-10-CM | POA: Diagnosis not present

## 2024-02-26 DIAGNOSIS — L728 Other follicular cysts of the skin and subcutaneous tissue: Secondary | ICD-10-CM | POA: Diagnosis not present

## 2024-02-26 DIAGNOSIS — L7211 Pilar cyst: Secondary | ICD-10-CM | POA: Diagnosis not present

## 2024-02-26 DIAGNOSIS — R208 Other disturbances of skin sensation: Secondary | ICD-10-CM | POA: Diagnosis not present

## 2024-03-04 DIAGNOSIS — Z1231 Encounter for screening mammogram for malignant neoplasm of breast: Secondary | ICD-10-CM | POA: Diagnosis not present

## 2024-04-21 DIAGNOSIS — N393 Stress incontinence (female) (male): Secondary | ICD-10-CM | POA: Diagnosis not present

## 2024-04-21 DIAGNOSIS — N3941 Urge incontinence: Secondary | ICD-10-CM | POA: Diagnosis not present

## 2024-05-03 DIAGNOSIS — L814 Other melanin hyperpigmentation: Secondary | ICD-10-CM | POA: Diagnosis not present

## 2024-05-03 DIAGNOSIS — L718 Other rosacea: Secondary | ICD-10-CM | POA: Diagnosis not present

## 2024-05-03 DIAGNOSIS — D485 Neoplasm of uncertain behavior of skin: Secondary | ICD-10-CM | POA: Diagnosis not present

## 2024-05-03 DIAGNOSIS — L821 Other seborrheic keratosis: Secondary | ICD-10-CM | POA: Diagnosis not present

## 2024-05-03 DIAGNOSIS — L820 Inflamed seborrheic keratosis: Secondary | ICD-10-CM | POA: Diagnosis not present

## 2024-05-03 DIAGNOSIS — D235 Other benign neoplasm of skin of trunk: Secondary | ICD-10-CM | POA: Diagnosis not present

## 2024-05-03 DIAGNOSIS — Z4802 Encounter for removal of sutures: Secondary | ICD-10-CM | POA: Diagnosis not present

## 2024-05-03 DIAGNOSIS — D1801 Hemangioma of skin and subcutaneous tissue: Secondary | ICD-10-CM | POA: Diagnosis not present

## 2024-05-03 DIAGNOSIS — L853 Xerosis cutis: Secondary | ICD-10-CM | POA: Diagnosis not present

## 2024-05-03 DIAGNOSIS — Z01818 Encounter for other preprocedural examination: Secondary | ICD-10-CM | POA: Diagnosis not present

## 2024-05-05 DIAGNOSIS — L03011 Cellulitis of right finger: Secondary | ICD-10-CM | POA: Diagnosis not present

## 2024-05-05 DIAGNOSIS — M1811 Unilateral primary osteoarthritis of first carpometacarpal joint, right hand: Secondary | ICD-10-CM | POA: Diagnosis not present

## 2024-05-05 DIAGNOSIS — M65351 Trigger finger, right little finger: Secondary | ICD-10-CM | POA: Diagnosis not present

## 2024-05-11 DIAGNOSIS — N819 Female genital prolapse, unspecified: Secondary | ICD-10-CM | POA: Diagnosis not present

## 2024-05-11 DIAGNOSIS — N8189 Other female genital prolapse: Secondary | ICD-10-CM | POA: Diagnosis not present

## 2024-05-11 DIAGNOSIS — Z882 Allergy status to sulfonamides status: Secondary | ICD-10-CM | POA: Diagnosis not present

## 2024-05-11 DIAGNOSIS — N816 Rectocele: Secondary | ICD-10-CM | POA: Diagnosis not present

## 2024-05-11 DIAGNOSIS — N8110 Cystocele, unspecified: Secondary | ICD-10-CM | POA: Diagnosis not present

## 2024-05-11 DIAGNOSIS — N393 Stress incontinence (female) (male): Secondary | ICD-10-CM | POA: Diagnosis not present

## 2024-05-11 DIAGNOSIS — F419 Anxiety disorder, unspecified: Secondary | ICD-10-CM | POA: Diagnosis not present

## 2024-05-11 DIAGNOSIS — Z888 Allergy status to other drugs, medicaments and biological substances status: Secondary | ICD-10-CM | POA: Diagnosis not present

## 2024-05-11 DIAGNOSIS — K219 Gastro-esophageal reflux disease without esophagitis: Secondary | ICD-10-CM | POA: Diagnosis not present

## 2024-05-21 DIAGNOSIS — N3946 Mixed incontinence: Secondary | ICD-10-CM | POA: Diagnosis not present

## 2024-05-26 DIAGNOSIS — N3281 Overactive bladder: Secondary | ICD-10-CM | POA: Diagnosis not present

## 2024-05-26 DIAGNOSIS — N3941 Urge incontinence: Secondary | ICD-10-CM | POA: Diagnosis not present

## 2024-05-26 DIAGNOSIS — N393 Stress incontinence (female) (male): Secondary | ICD-10-CM | POA: Diagnosis not present

## 2024-05-26 DIAGNOSIS — N8110 Cystocele, unspecified: Secondary | ICD-10-CM | POA: Diagnosis not present
